# Patient Record
Sex: Female | Born: 1980
Health system: Southern US, Community
[De-identification: ages and names within clinical notes are randomized; demographics above are authoritative.]

## PROBLEM LIST (undated history)

## (undated) DIAGNOSIS — M549 Dorsalgia, unspecified: Secondary | ICD-10-CM

## (undated) DIAGNOSIS — F32A Depression, unspecified: Secondary | ICD-10-CM

## (undated) DIAGNOSIS — Z8669 Personal history of other diseases of the nervous system and sense organs: Secondary | ICD-10-CM

## (undated) DIAGNOSIS — J45909 Unspecified asthma, uncomplicated: Secondary | ICD-10-CM

## (undated) DIAGNOSIS — Q439 Congenital malformation of intestine, unspecified: Secondary | ICD-10-CM

## (undated) DIAGNOSIS — M5430 Sciatica, unspecified side: Secondary | ICD-10-CM

## (undated) DIAGNOSIS — F329 Major depressive disorder, single episode, unspecified: Secondary | ICD-10-CM

## (undated) DIAGNOSIS — G8929 Other chronic pain: Secondary | ICD-10-CM

## (undated) HISTORY — DX: Personal history of other diseases of the nervous system and sense organs: Z86.69

## (undated) HISTORY — PX: CHOLECYSTECTOMY: SHX55

## (undated) HISTORY — DX: Unspecified asthma, uncomplicated: J45.909

## (undated) HISTORY — PX: HAND SURGERY: SHX662

## (undated) HISTORY — PX: OTHER SURGICAL HISTORY: SHX169

## (undated) HISTORY — DX: Depression, unspecified: F32.A

## (undated) HISTORY — PX: GALLBLADDER SURGERY: SHX652

## (undated) HISTORY — DX: Major depressive disorder, single episode, unspecified: F32.9

---

## 2008-10-20 ENCOUNTER — Emergency Department (HOSPITAL_COMMUNITY): Admission: EM | Admit: 2008-10-20 | Discharge: 2008-10-21 | Payer: Self-pay | Admitting: Emergency Medicine

## 2009-05-28 ENCOUNTER — Emergency Department (HOSPITAL_COMMUNITY): Admission: EM | Admit: 2009-05-28 | Discharge: 2009-05-28 | Payer: Self-pay | Admitting: Emergency Medicine

## 2009-11-13 ENCOUNTER — Emergency Department (HOSPITAL_COMMUNITY): Admission: EM | Admit: 2009-11-13 | Discharge: 2009-11-14 | Payer: Self-pay | Admitting: Emergency Medicine

## 2010-09-15 ENCOUNTER — Encounter: Payer: Self-pay | Admitting: Cardiology

## 2010-09-24 NOTE — Letter (Signed)
Summary: External Correspondence/ PROGRESS NOTE DR. Loney Hering  External Correspondence/ PROGRESS NOTE DR. Loney Hering   Imported By: Dorise Hiss 09/15/2010 16:57:43  _____________________________________________________________________  External Attachment:    Type:   Image     Comment:   External Document

## 2010-10-09 ENCOUNTER — Encounter: Payer: Self-pay | Admitting: *Deleted

## 2010-10-27 ENCOUNTER — Ambulatory Visit (INDEPENDENT_AMBULATORY_CARE_PROVIDER_SITE_OTHER): Payer: Medicaid Other | Admitting: Cardiology

## 2010-10-27 ENCOUNTER — Encounter: Payer: Self-pay | Admitting: Cardiology

## 2010-10-27 VITALS — BP 114/82 | HR 76 | Ht 66.0 in | Wt 256.0 lb

## 2010-10-27 DIAGNOSIS — Z720 Tobacco use: Secondary | ICD-10-CM | POA: Insufficient documentation

## 2010-10-27 DIAGNOSIS — R0989 Other specified symptoms and signs involving the circulatory and respiratory systems: Secondary | ICD-10-CM

## 2010-10-27 DIAGNOSIS — F17200 Nicotine dependence, unspecified, uncomplicated: Secondary | ICD-10-CM

## 2010-10-27 DIAGNOSIS — R0602 Shortness of breath: Secondary | ICD-10-CM

## 2010-10-27 DIAGNOSIS — R0600 Dyspnea, unspecified: Secondary | ICD-10-CM | POA: Insufficient documentation

## 2010-10-27 DIAGNOSIS — R002 Palpitations: Secondary | ICD-10-CM

## 2010-10-27 DIAGNOSIS — R0609 Other forms of dyspnea: Secondary | ICD-10-CM

## 2010-10-27 NOTE — Assessment & Plan Note (Signed)
Patient quit smoking back in November 2011. I encouraged her to continue efforts at complete cessation.

## 2010-10-27 NOTE — Progress Notes (Signed)
Clinical Summary Pamela Duran is a 30 y.o.female referred for cardiology consultation. Records indicate followup with Dr. Loney Duran back in February with complaints of rapid palpitations and shortness of breath. She states that since early February, she has been experiencing rapid palpitations, noted sporadically throughout the day, perhaps 1-2 times, and occurring almost on a daily basis recently. She feels short of breath with these episodes, but has not had any unusual dyspnea on exertion or exertional chest pain. Mild dizziness noted but no frank syncope. Typical episodes last for only a few seconds to perhaps one minute at most.  She does not endorse any use of caffeine or other stimulants. She reports no medication changes recently.  Allergies  Allergen Reactions  . Sulfa Antibiotics Hives    Current Outpatient Prescriptions on File Prior to Visit  Medication Sig Dispense Refill  . ALPRAZolam (XANAX) 0.5 MG tablet Take 1 tablet by mouth twice per day as needed.       Marland Kitchen amitriptyline (ELAVIL) 25 MG tablet Take 1 tablet by mouth every night.       Marland Kitchen buPROPion (WELLBUTRIN) 100 MG tablet Take 1 tablet by mouth twice per day.       . cyclobenzaprine (FLEXERIL) 10 MG tablet Take 1 tablet by mouth three times per day.       . SUMAtriptan (IMITREX) 100 MG tablet Take 1 tablet by mouth daily as needed for headaches.       . triamcinolone (KENALOG) 0.1 % cream Apply three times per day as directed.       . hydrocortisone (ANUSOL-HC) 2.5 % rectal cream Apply twice per day as directed.         Past Medical History  Diagnosis Date  . Depression   . History of migraine headaches     Past Surgical History  Procedure Date  . Bilateral tubal ligation     Family History  Problem Relation Age of Onset  . Healthy Mother     No premature cardiovascular disease or known cardiac arrhythmias    Social History Pamela Duran reports that she quit smoking about 4 months ago. Her smoking use included  Cigarettes. She has never used smokeless tobacco. Pamela Duran reports that she drinks alcohol.  Review of Systems No fevers, chills, or unusual weight change. No chest pain, cough, hemoptysis, or wheezing. No dysphasia or odynophagia. Stable appetite with no abdominal pain, melena, or hematochezia. No orthopnea, PND, or lower extremity edema. Dry skin. No focal motor weakness, memory problems, or speech deficits. Otherwise systems reviewed and negative except as already outlined.  Physical Examination Filed Vitals:   10/27/10 1319  BP: 114/82  Pulse: 76  Obese young woman in no acute distress. HEENT: Conjunctiva and lids normal, oropharynx clear with moist mucosa. Neck: Supple, no elevated JVP or carotid bruits, no thyromegaly or thyroid tenderness. Lungs: Clear to auscultation, nonlabored breathing at rest. Cardiac: Regular rate and rhythm, no S3 or significant systolic murmur, no pericardial rub. Abdomen: Soft, nontender, no hepatomegaly, bowel sounds present, no guarding or rebound. Extremities: No pitting edema, distal pulses 2+. Skin: Warm and dry. No rashes. Musculoskeletal: No kyphosis. Neuropsychiatric: Alert and oriented x3, affect grossly appropriate.  ECG Faxed copy of ECG from 7 February shows normal sinus rhythm at 71 beats per minute, small R prime in lead V1 and V2. Otherwise normal intervals.  Problem List and Plan

## 2010-10-27 NOTE — Assessment & Plan Note (Signed)
Described only with the above palpitations, not at rest or with typical exertion.

## 2010-10-27 NOTE — Patient Instructions (Signed)
Your physician has recommended that you wear a holter monitor. Holter monitors are medical devices that record the heart's electrical activity. Doctors most often use these monitors to diagnose arrhythmias. Arrhythmias are problems with the speed or rhythm of the heartbeat. The monitor is a small, portable device. You can wear one while you do your normal daily activities. This is usually used to diagnose what is causing palpitations/syncope (passing out). Your follow up will be based on your test results. If the results of your test are normal or stable, you will receive a letter. If they are abnormal, the nurse will contact you by phone.  

## 2010-10-27 NOTE — Assessment & Plan Note (Signed)
Episodic, generally brief, rapid palpitations as outlined above. Symptoms noted since early February, not associated with frank syncope. She does experience some shortness of breath with these symptoms, although not typically with exertion. ECG is relatively benign, with normal intervals, and no clear evidence of preexcitation. She is experiencing these symptoms almost on a daily basis at this point. No caffeine use at present or other reported stimulants. Plan is to proceed with a 48-hour Holter monitor to objectively document heart rate variability and assess for any potential paroxysmal arrhythmias. Our office will contact her with the results, and can arrange followup as needed.

## 2013-03-06 ENCOUNTER — Encounter: Payer: Self-pay | Admitting: Neurology

## 2013-03-08 ENCOUNTER — Encounter: Payer: Medicaid Other | Admitting: Neurology

## 2013-03-08 ENCOUNTER — Encounter: Payer: Self-pay | Admitting: Neurology

## 2013-03-08 NOTE — Progress Notes (Deleted)
[facility]  Provider:  Dr Hosie Poisson Referring Provider: Ernestine Conrad, MD Primary Care Physician:  Ernestine Conrad, MD  No chief complaint on file.   HPI:    Pamela Duran is a 32 y.o. female here as a referral from Dr. Loney Hering for ***.  *** C5: pain neck, shoulder, scapula, numb lateral arm, weak deltoid, effect biceps/bracio reflex  C6; pain, neck, shoulder, lat arm, lat hand. Numb lat forearm, thumb and index finger, weak elbow flexio, forearm supination and prontation, decreased biceps and brachio  C7:pain neck, shoulder, middle finger; numb index and middle finger, palm; weak elbow and wrist ext, wrist flexion, prontation; decreasd triceps relfex  C8: pain neck, shoulder, medial forearm, 4/5th dig; numb same area; weak finger extension, wrist extension, distal finger flexion, distal thumb flexion  T1: pain neck, medial arm and forearm; numb anterior arm and medial forearm; weak thumb abduction, distal thumb flexion and finger abduction and adduction  -worse with flexion of elbow or sleeping For Ulnar: -pinky finger sticks out -weak finger grip -weak pincer grip btwn thumb and index (Flex distal thumb equals Froment sign) For Median: -weakness thumb aBduction and Opposition, FDP -Sensation intact over thenar eminence (palmar cutaneous branch) Review of Systems: Out of a complete 14 system review, the patient complains of only the following symptoms, and all other reviewed systems are negative. ***  History   Social History  . Marital Status: Legally Separated    Spouse Name: N/A    Number of Children: 1  . Years of Education: N/A   Occupational History  . Unemployed    Social History Main Topics  . Smoking status: Former Smoker    Types: Cigarettes    Quit date: 06/09/2010  . Smokeless tobacco: Never Used  . Alcohol Use: 0.0 oz/week  . Drug Use: No  . Sexually Active: Not on file   Other Topics Concern  . Not on file   Social History  Narrative  . No narrative on file    Family History  Problem Relation Age of Onset  . Healthy Mother     No premature cardiovascular disease or known cardiac arrhythmias    Past Medical History  Diagnosis Date  . Depression   . History of migraine headaches   . Asthma     Past Surgical History  Procedure Laterality Date  . Bilateral tubal ligation    . Gallbladder surgery      Current Outpatient Prescriptions  Medication Sig Dispense Refill  . ALPRAZolam (XANAX) 0.5 MG tablet Take 1 tablet by mouth twice per day as needed.       Marland Kitchen amitriptyline (ELAVIL) 50 MG tablet Take 50 mg by mouth at bedtime.      Marland Kitchen buPROPion (WELLBUTRIN) 100 MG tablet Take 1 tablet by mouth twice per day.       . cetirizine (ZYRTEC ALLERGY) 10 MG tablet Take 10 mg by mouth daily. As needed      . cyclobenzaprine (FLEXERIL) 10 MG tablet Take 1 tablet by mouth three times per day.       . hydrocortisone (ANUSOL-HC) 2.5 % rectal cream Apply twice per day as directed.       . Misc Natural Products (FIBER 7 PO) Take by mouth. 1 po daily       . omeprazole (PRILOSEC) 20 MG capsule Take 20 mg by mouth daily.      . SUMAtriptan (IMITREX) 100 MG tablet Take 1 tablet by mouth daily as needed for headaches.       Marland Kitchen  triamcinolone (KENALOG) 0.1 % cream Apply three times per day as directed.       . varenicline (CHANTIX) 1 MG tablet Take 1 mg by mouth 2 (two) times daily.       No current facility-administered medications for this visit.    Allergies as of 03/08/2013 - Review Complete 03/06/2013  Allergen Reaction Noted  . Sulfa antibiotics Hives     Vitals: There were no vitals taken for this visit. Last Weight:  Wt Readings from Last 1 Encounters:  03/06/13 253 lb (114.76 kg)   Last Height:   Ht Readings from Last 1 Encounters:  03/06/13 5\' 7"  (1.702 m)     Physical exam: Exam: Gen: NAD, conversant Eyes: anicteric sclerae, moist conjunctivae HENT: [place] Neck: Trachea midline; supple,   Lungs: CTA, no wheezing, rales, rhonic                          CV: RRR, no MRG Abdomen: Soft, non-tender;  Extremities: No peripheral edema  Skin: Normal temperature, no rash,  Psych: Appropriate affect, pleasant  Neuro: MS: AA&Ox3, appropriately interactive, normal affect   Attention: WORLD backwards  Speech: fluent w/o paraphasic error  Memory: good recent and remote recall  CN: PERRL, EOMI no nystagmus, no ptosis, sensation intact to LT V1-V3 bilat, face symmetric, no weakness, hearing grossly intact, palate elevates symmetrically, shoulder shrug 5/5 bilat,  tongue protrudes midline, no fasiculations noted.  Motor: normal bulk and tone Strength: 5/5  In all extremities  Coord: rapid alternating and point-to-point (FNF, HTS) movements intact.  Reflexes: symmetrical, bilat downgoing toes  Sens: LT intact in all extremities  Gait: posture, stance, stride and arm-swing normal. Tandem gait intact. Able to walk on heels and toes. Romberg absent.   Assessment:  After physical and neurologic examination, review of laboratory studies, imaging, neurophysiology testing and pre-existing records, assessment will be reviewed on the problem list.  Plan:  Treatment plan and additional workup will be reviewed under Problem List.

## 2013-03-08 NOTE — Progress Notes (Signed)
This encounter was created in error - please disregard.  Patient was a no-show.

## 2014-01-01 ENCOUNTER — Emergency Department (HOSPITAL_COMMUNITY)
Admission: EM | Admit: 2014-01-01 | Discharge: 2014-01-01 | Disposition: A | Discharge status: Home / Self Care | Payer: Medicaid Other | Attending: Emergency Medicine | Admitting: Emergency Medicine

## 2014-01-01 ENCOUNTER — Encounter (HOSPITAL_COMMUNITY): Payer: Self-pay | Admitting: Emergency Medicine

## 2014-01-01 ENCOUNTER — Emergency Department (HOSPITAL_COMMUNITY): Payer: Medicaid Other

## 2014-01-01 DIAGNOSIS — J45901 Unspecified asthma with (acute) exacerbation: Secondary | ICD-10-CM | POA: Insufficient documentation

## 2014-01-01 DIAGNOSIS — Y9389 Activity, other specified: Secondary | ICD-10-CM | POA: Insufficient documentation

## 2014-01-01 DIAGNOSIS — F329 Major depressive disorder, single episode, unspecified: Secondary | ICD-10-CM | POA: Insufficient documentation

## 2014-01-01 DIAGNOSIS — Z79899 Other long term (current) drug therapy: Secondary | ICD-10-CM | POA: Insufficient documentation

## 2014-01-01 DIAGNOSIS — Y929 Unspecified place or not applicable: Secondary | ICD-10-CM | POA: Insufficient documentation

## 2014-01-01 DIAGNOSIS — G43909 Migraine, unspecified, not intractable, without status migrainosus: Secondary | ICD-10-CM | POA: Insufficient documentation

## 2014-01-01 DIAGNOSIS — S8392XA Sprain of unspecified site of left knee, initial encounter: Secondary | ICD-10-CM

## 2014-01-01 DIAGNOSIS — W108XXA Fall (on) (from) other stairs and steps, initial encounter: Secondary | ICD-10-CM | POA: Insufficient documentation

## 2014-01-01 DIAGNOSIS — Z87891 Personal history of nicotine dependence: Secondary | ICD-10-CM | POA: Insufficient documentation

## 2014-01-01 DIAGNOSIS — IMO0002 Reserved for concepts with insufficient information to code with codable children: Secondary | ICD-10-CM | POA: Insufficient documentation

## 2014-01-01 DIAGNOSIS — F3289 Other specified depressive episodes: Secondary | ICD-10-CM | POA: Insufficient documentation

## 2014-01-01 HISTORY — DX: Congenital malformation of intestine, unspecified: Q43.9

## 2014-01-01 MED ORDER — HYDROCODONE-ACETAMINOPHEN 5-325 MG PO TABS: 1.0000 | ORAL_TABLET | ORAL | Status: DC | PRN

## 2014-01-01 MED ORDER — MELOXICAM 7.5 MG PO TABS: ORAL_TABLET | ORAL | Status: DC

## 2014-01-01 NOTE — Discharge Instructions (Signed)
Your knee x-ray is negative for fracture or dislocation or effusion. Please use the knee immobilizer until you're seen by the orthopedic specialist. You do not have to sleep in this device. Please use Mobic 2 times daily with food. May use Norco for pain if needed. This medication may cause drowsiness, please use with caution. Please see the orthopedic specialist as sone as possible. Knee Sprain A knee sprain is a tear in the strong bands of tissue that connect the bones (ligaments) of your knee. HOME CARE  Raise (elevate) your injured knee to lessen puffiness (swelling).  To ease pain and puffiness, put ice on the injured area.  Put ice in a plastic bag.  Place a towel between your skin and the bag.  Leave the ice on for 20 minutes, 2 3 times a day.  Only take medicine as told by your doctor.  Do not leave your knee unprotected until pain and stiffness go away (usually 4 6 weeks).  If you have a cast or splint, do not get it wet. If your doctor told you to not take it off, cover it with a plastic bag when you shower or bathe. Do not swim.  Your doctor may have you do exercises to prevent or limit permanent weakness and stiffness. GET HELP RIGHT AWAY IF:   Your cast or splint becomes damaged.  Your pain gets worse.  You have a lot of pain, puffiness, or numbness below the cast or splint. MAKE SURE YOU:   Understand these instructions.  Will watch your condition.  Will get help right away if you are not doing well or get worse. Document Released: 07/14/2009 Document Revised: 05/16/2013 Document Reviewed: 04/03/2013 [facility] Patient Information 2014 [place], [place].

## 2014-01-01 NOTE — ED Notes (Signed)
Coming down steps and left knee gave way.  C/o pain to that knee.

## 2014-01-01 NOTE — ED Notes (Signed)
Pt was going down steps and lt knee "gave way" then fell, Pain rt knee. Says she has had pain in this knee in past , but has never had it checked.  Ice pack applied.

## 2014-01-01 NOTE — ED Provider Notes (Signed)
CSN: [id number]     Arrival date & time 01/01/14  1129 History   First MD Initiated Contact with Patient 01/01/14 1224     Chief Complaint  Patient presents with  . Knee Injury     (Consider location/radiation/quality/duration/timing/severity/associated sxs/prior Treatment) Patient is a [age] y.o. female presenting with knee pain. The history is provided by the patient.  Knee Pain Location:  Knee Time since incident:  1 hour Injury: yes   Mechanism of injury: fall   Fall:    Fall occurred: 2 steps.   Impact surface:  Hard floor   Point of impact:  Knees   Entrapped after fall: no   Knee location:  L knee Pain details:    Quality:  Sharp   Severity:  Moderate   Onset quality:  Sudden   Duration:  1 hour   Timing:  Constant   Progression:  Worsening Chronicity: Pt has had problem with the left knee in the past, but not medical evaluation. Dislocation: no   Relieved by:  Nothing Worsened by:  Bearing weight Ineffective treatments:  None tried Associated symptoms: decreased ROM   Associated symptoms: no back pain, no neck pain, no numbness and no tingling   Risk factors: no frequent fractures     Past Medical History  Diagnosis Date  . Depression     complex regional pain syndrome   . History of migraine headaches   . Asthma   . Complex anorectal malformation    Past Surgical History  Procedure Laterality Date  . Bilateral tubal ligation    . Gallbladder surgery     Family History  Problem Relation Age of Onset  . Healthy Mother     No premature cardiovascular disease or known cardiac arrhythmias   History  Substance Use Topics  . Smoking status: Former Smoker    Types: Cigarettes    Quit date: 06/09/2010  . Smokeless tobacco: Never Used  . Alcohol Use: 0.0 oz/week   OB History   Grav Para Term Preterm Abortions TAB SAB Ect Mult Living                 Review of Systems  Constitutional: Negative for activity change.       All ROS Neg except as noted in  HPI  HENT: Negative for nosebleeds.   Eyes: Negative for photophobia and discharge.  Respiratory: Positive for wheezing. Negative for cough and shortness of breath.   Cardiovascular: Negative for chest pain and palpitations.  Gastrointestinal: Negative for abdominal pain and blood in stool.  Genitourinary: Negative for dysuria, frequency and hematuria.  Musculoskeletal: Positive for arthralgias. Negative for back pain and neck pain.  Skin: Negative.   Neurological: Negative for dizziness, seizures and speech difficulty.  Psychiatric/Behavioral: Negative for hallucinations and confusion.      Allergies  Sulfa antibiotics  Home Medications   Prior to Admission medications   Medication Sig Start Date End Date Taking? Authorizing Provider  acetaminophen (TYLENOL) 325 MG tablet Take 650 mg by mouth every 6 (six) hours as needed for mild pain.   Yes Historical Provider, MD  ALPRAZolam Prudy Feeler(XANAX) 0.5 MG tablet Take 0.5 mg by mouth 2 (two) times daily as needed for anxiety. Take 1 tablet by mouth twice per day as needed.   Yes Historical Provider, MD  amitriptyline (ELAVIL) 50 MG tablet Take 50 mg by mouth at bedtime as needed (migraines).    Yes Historical Provider, MD  buPROPion (WELLBUTRIN) 100 MG tablet Take 1 tablet  by mouth twice per day.    Yes Historical Provider, MD  cetirizine (ZYRTEC ALLERGY) 10 MG tablet Take 10 mg by mouth daily.    Yes Historical Provider, MD  omeprazole (PRILOSEC) 20 MG capsule Take 20 mg by mouth daily.   Yes Historical Provider, MD  SUMAtriptan (IMITREX) 100 MG tablet Take 1 tablet by mouth daily as needed for headaches.    Yes Historical Provider, MD   BP 113/10  Pulse 74  Temp(Src) 98.3 F (36.8 C) (Oral)  Resp 18  Ht 5\' 7"  (1.702 m)  Wt 273 lb (123.832 kg)  BMI 42.75 kg/m2  SpO2 97%  LMP 12/04/2013 Physical Exam  Nursing note and vitals reviewed. Constitutional: She is oriented to person, place, and time. She appears well-developed and  well-nourished.  Non-toxic appearance.  HENT:  Head: Normocephalic.  Right Ear: Tympanic membrane and external ear normal.  Left Ear: Tympanic membrane and external ear normal.  Eyes: EOM and lids are normal. Pupils are equal, round, and reactive to light.  Neck: Normal range of motion. Neck supple. Carotid bruit is not present.  Cardiovascular: Normal rate, regular rhythm, normal heart sounds, intact distal pulses and normal pulses.   Pulmonary/Chest: Breath sounds normal. No respiratory distress.  Abdominal: Soft. Bowel sounds are normal. There is no tenderness. There is no guarding.  Musculoskeletal:       Left knee: She exhibits decreased range of motion. She exhibits no deformity and no erythema. Tenderness found. Lateral joint line tenderness noted.       Legs: FROM of the left hip and ankle. Achilles intact. DP 2+.  Lymphadenopathy:       Head (right side): No submandibular adenopathy present.       Head (left side): No submandibular adenopathy present.    She has no cervical adenopathy.  Neurological: She is alert and oriented to person, place, and time. She has normal strength. No cranial nerve deficit or sensory deficit.  Skin: Skin is warm and dry.  Psychiatric: She has a normal mood and affect. Her speech is normal.    ED Course  Procedures (including critical care time) Labs Review Labs Reviewed - No data to display  Imaging Review No results found.   EKG Interpretation None      MDM Xray  Of the left knee is neg for fx or dislocation. Pt placed in immobilizer, and given Rx for norco and mobic. Pt to follow up with orthopedics if not improving.   Final diagnoses:  None    **I have reviewed nursing notes, vital signs, and all appropriate lab and imaging results for this patient.Kathie Dike, PA-C 01/02/14 [phone]

## 2014-01-02 NOTE — ED Provider Notes (Signed)
Medical screening examination/treatment/procedure(s) were performed by non-physician practitioner and as supervising physician I was immediately available for consultation/collaboration.   EKG Interpretation None      \  Benny Lennert, MD 01/02/14 [phone]

## 2014-04-29 ENCOUNTER — Emergency Department (HOSPITAL_COMMUNITY)
Admission: EM | Admit: 2014-04-29 | Discharge: 2014-04-29 | Disposition: A | Discharge status: Home / Self Care | Payer: Medicaid Other | Attending: Emergency Medicine | Admitting: Emergency Medicine

## 2014-04-29 ENCOUNTER — Encounter (HOSPITAL_COMMUNITY): Payer: Self-pay | Admitting: Emergency Medicine

## 2014-04-29 DIAGNOSIS — M5431 Sciatica, right side: Secondary | ICD-10-CM

## 2014-04-29 DIAGNOSIS — M5430 Sciatica, unspecified side: Secondary | ICD-10-CM | POA: Diagnosis not present

## 2014-04-29 DIAGNOSIS — IMO0002 Reserved for concepts with insufficient information to code with codable children: Secondary | ICD-10-CM | POA: Diagnosis not present

## 2014-04-29 DIAGNOSIS — Z79899 Other long term (current) drug therapy: Secondary | ICD-10-CM | POA: Diagnosis not present

## 2014-04-29 DIAGNOSIS — Z880 Allergy status to penicillin: Secondary | ICD-10-CM | POA: Insufficient documentation

## 2014-04-29 DIAGNOSIS — M545 Low back pain, unspecified: Secondary | ICD-10-CM | POA: Insufficient documentation

## 2014-04-29 DIAGNOSIS — Q438 Other specified congenital malformations of intestine: Secondary | ICD-10-CM | POA: Diagnosis not present

## 2014-04-29 DIAGNOSIS — R5381 Other malaise: Secondary | ICD-10-CM | POA: Diagnosis not present

## 2014-04-29 DIAGNOSIS — E669 Obesity, unspecified: Secondary | ICD-10-CM | POA: Insufficient documentation

## 2014-04-29 DIAGNOSIS — J45909 Unspecified asthma, uncomplicated: Secondary | ICD-10-CM | POA: Insufficient documentation

## 2014-04-29 DIAGNOSIS — F17200 Nicotine dependence, unspecified, uncomplicated: Secondary | ICD-10-CM | POA: Diagnosis not present

## 2014-04-29 DIAGNOSIS — R5383 Other fatigue: Secondary | ICD-10-CM | POA: Diagnosis not present

## 2014-04-29 DIAGNOSIS — F329 Major depressive disorder, single episode, unspecified: Secondary | ICD-10-CM | POA: Insufficient documentation

## 2014-04-29 DIAGNOSIS — F3289 Other specified depressive episodes: Secondary | ICD-10-CM | POA: Insufficient documentation

## 2014-04-29 MED ORDER — HYDROCODONE-ACETAMINOPHEN 5-325 MG PO TABS: 1.0000 | ORAL_TABLET | ORAL | Status: DC | PRN

## 2014-04-29 MED ORDER — PREDNISONE 10 MG PO TABS: ORAL_TABLET | ORAL | Status: DC

## 2014-04-29 NOTE — Discharge Instructions (Signed)
Sciatica °Sciatica is pain, weakness, numbness, or tingling along the path of the sciatic nerve. The nerve starts in the lower back and runs down the back of each leg. The nerve controls the muscles in the lower leg and in the back of the knee, while also providing sensation to the back of the thigh, lower leg, and the sole of your foot. Sciatica is a symptom of another medical condition. For instance, nerve damage or certain conditions, such as a herniated disk or bone spur on the spine, pinch or put pressure on the sciatic nerve. This causes the pain, weakness, or other sensations normally associated with sciatica. Generally, sciatica only affects one side of the body. °CAUSES  °· Herniated or slipped disc. °· Degenerative disk disease. °· A pain disorder involving the narrow muscle in the buttocks (piriformis syndrome). °· Pelvic injury or fracture. °· Pregnancy. °· Tumor (rare). °SYMPTOMS  °Symptoms can vary from mild to very severe. The symptoms usually travel from the low back to the buttocks and down the back of the leg. Symptoms can include: °· Mild tingling or dull aches in the lower back, leg, or hip. °· Numbness in the back of the calf or sole of the foot. °· Burning sensations in the lower back, leg, or hip. °· Sharp pains in the lower back, leg, or hip. °· Leg weakness. °· Severe back pain inhibiting movement. °These symptoms may get worse with coughing, sneezing, laughing, or prolonged sitting or standing. Also, being overweight may worsen symptoms. °DIAGNOSIS  °Your caregiver will perform a physical exam to look for common symptoms of sciatica. He or she may ask you to do certain movements or activities that would trigger sciatic nerve pain. Other tests may be performed to find the cause of the sciatica. These may include: °· Blood tests. °· X-rays. °· Imaging tests, such as an MRI or CT scan. °TREATMENT  °Treatment is directed at the cause of the sciatic pain. Sometimes, treatment is not necessary  and the pain and discomfort goes away on its own. If treatment is needed, your caregiver may suggest: °· Over-the-counter medicines to relieve pain. °· Prescription medicines, such as anti-inflammatory medicine, muscle relaxants, or narcotics. °· Applying heat or ice to the painful area. °· Steroid injections to lessen pain, irritation, and inflammation around the nerve. °· Reducing activity during periods of pain. °· Exercising and stretching to strengthen your abdomen and improve flexibility of your spine. Your caregiver may suggest losing weight if the extra weight makes the back pain worse. °· Physical therapy. °· Surgery to eliminate what is pressing or pinching the nerve, such as a bone spur or part of a herniated disk. °HOME CARE INSTRUCTIONS  °· Only take over-the-counter or prescription medicines for pain or discomfort as directed by your caregiver. °· Apply ice to the affected area for 20 minutes, 3-4 times a day for the first 48-72 hours. Then try heat in the same way. °· Exercise, stretch, or perform your usual activities if these do not aggravate your pain. °· Attend physical therapy sessions as directed by your caregiver. °· Keep all follow-up appointments as directed by your caregiver. °· Do not wear high heels or shoes that do not provide proper support. °· Check your mattress to see if it is too soft. A firm mattress may lessen your pain and discomfort. °SEEK IMMEDIATE MEDICAL CARE IF:  °· You lose control of your bowel or bladder (incontinence). °· You have increasing weakness in the lower back, pelvis, buttocks,   or legs.  You have redness or swelling of your back.  You have a burning sensation when you urinate.  You have pain that gets worse when you lie down or awakens you at night.  Your pain is worse than you have experienced in the past.  Your pain is lasting longer than 4 weeks.  You are suddenly losing weight without reason. MAKE SURE YOU:  Understand these  instructions.  Will watch your condition.  Will get help right away if you are not doing well or get worse. Document Released: 07/20/2001 Document Revised: 01/25/2012 Document Reviewed: 12/05/2011 [facility] Patient Information 2015 [place], [place]. This information is not intended to replace advice given to you by your health care provider. Make sure you discuss any questions you have with your health care provider.    Use the the other medicines as directed.  Do not drive within 4 hours of taking hydrocodone as this will make you drowsy.  Avoid lifting,  Bending,  Twisting or any other activity that worsens your pain over the next week.  Apply a heating pad 20 minutes 3 times daily.  Add your Flexeril as discussed 3 times daily.  You should get rechecked if your symptoms are not better over the next 5 days,  Or you develop increased pain,  Weakness in your leg(s) or loss of bladder or bowel function - these are symptoms of a worse injury.

## 2014-04-29 NOTE — ED Notes (Signed)
Pt was moving furniture this am. And felt pain in rt low back with radiation down rt leg

## 2014-04-29 NOTE — ED Notes (Signed)
Pt c/o lower back pain radiating down right leg after moving furniture this am.

## 2014-04-29 NOTE — ED Provider Notes (Signed)
CSN: [id number]     Arrival date & time 04/29/14  1717 History  This chart was scribed for Burgess Amor, PA with Donnetta Hutching, MD by Tonye Royalty, ED Scribe. This patient was seen in room APFT23/APFT23 and the patient's care was started at [facility] PM.    Chief Complaint  Patient presents with  . Back Pain   The history is provided by the patient. No language interpreter was used.   HPI Comments: Pamela Duran is a 33 y.o. female who presents to the Emergency Department complaining of right-sided lower back pain radiating down her right leg with gradual onset this morning after moving furniture. She reports chronically having back pain when she sits or stands for too long; she states she has never seen a doctor for this pain. She states her current pain feels different from her chronic pain. She states her legs feel like they "want to give out." She reports using Tylenol and Motrin without noticeable improvement in symptoms.  She denies loss of bowel or bladder function including incontinence or urinary retention.   Past Medical History  Diagnosis Date  . Depression     complex regional pain syndrome   . History of migraine headaches   . Asthma   . Complex anorectal malformation    Past Surgical History  Procedure Laterality Date  . Bilateral tubal ligation    . Gallbladder surgery     Family History  Problem Relation Age of Onset  . Healthy Mother     No premature cardiovascular disease or known cardiac arrhythmias   History  Substance Use Topics  . Smoking status: Current Every Day Smoker -- 1.00 packs/day    Types: Cigarettes    Last Attempt to Quit: 06/09/2010  . Smokeless tobacco: Never Used  . Alcohol Use: 0.0 oz/week   OB History   Grav Para Term Preterm Abortions TAB SAB Ect Mult Living                 Review of Systems  Constitutional: Negative for fever.  Respiratory: Negative for shortness of breath.   Cardiovascular: Negative for chest pain and leg swelling.   Gastrointestinal: Negative for abdominal pain, constipation and abdominal distention.  Genitourinary: Negative for dysuria, urgency, frequency, flank pain and difficulty urinating.  Musculoskeletal: Positive for back pain (radiating down right leg). Negative for gait problem, joint swelling and myalgias.  Skin: Negative for rash.  Neurological: Positive for weakness. Negative for numbness.       Denies loss of bowel or bladder      Allergies  Penicillins and Sulfa antibiotics  Home Medications   Prior to Admission medications   Medication Sig Start Date End Date Taking? Authorizing Provider  acetaminophen (TYLENOL) 325 MG tablet Take 650 mg by mouth every 6 (six) hours as needed for mild pain.   Yes Historical Provider, MD  buPROPion (WELLBUTRIN) 100 MG tablet Take 1 tablet by mouth twice per day.    Yes Historical Provider, MD  cetirizine (ZYRTEC ALLERGY) 10 MG tablet Take 10 mg by mouth daily.    Yes Historical Provider, MD  omeprazole (PRILOSEC) 20 MG capsule Take 20 mg by mouth daily.   Yes Historical Provider, MD  HYDROcodone-acetaminophen (NORCO/VICODIN) 5-325 MG per tablet Take 1 tablet by mouth every 4 (four) hours as needed. 04/29/14   Burgess Amor, PA-C  predniSONE (DELTASONE) 10 MG tablet 6, 5, 4, 3, 2 then 1 tablet by mouth daily for 6 days total. 04/29/14   Raynelle Fanning  Mekia Dipinto, PA-C   BP 117/72  Pulse 75  Temp(Src) 99.1 F (37.3 C)  Resp 20  Ht  (1.702 m)  Wt 253 lb (114.76 kg)  BMI 39.62 kg/m2  SpO2 99% Physical Exam  Nursing note and vitals reviewed. Constitutional: She appears well-nourished.  Obese  HENT:  Head: Normocephalic.  Eyes: Conjunctivae are normal.  Neck: Normal range of motion. Neck supple.  Cardiovascular: Normal rate and intact distal pulses.   Pedal pulses normal.  Pulmonary/Chest: Effort normal.  Abdominal: Soft. Bowel sounds are normal. She exhibits no distension and no mass.  Musculoskeletal: Normal range of motion. She exhibits tenderness  (along midline lower lumbar and right paralumbar). She exhibits no edema.       Lumbar back: She exhibits tenderness. She exhibits no swelling, no edema and no spasm.  Negative straight leg raise  Neurological: She is alert. She has normal strength. She displays no atrophy and no tremor. No sensory deficit. Gait normal.  Reflex Scores:      Patellar reflexes are 2+ on the right side and 2+ on the left side.      Achilles reflexes are 2+ on the right side and 2+ on the left side. No strength deficit noted in hip and knee flexor and extensor muscle groups.  Ankle flexion and extension intact.  Skin: Skin is warm and dry.  Psychiatric: She has a normal mood and affect.    ED Course  Procedures (including critical care time) Labs Review Labs Reviewed - No data to display  Imaging Review No results found.   EKG Interpretation None     DIAGNOSTIC STUDIES: Oxygen Saturation is 99% on room air, normal by my interpretation.    COORDINATION OF CARE: 6:52 PM Discussed treatment plan with patient at beside, the patient agrees with the plan and has no further questions at this time.   MDM   Final diagnoses:  Sciatica, right    No neuro deficit on exam or by history to suggest emergent or surgical presentation.  Also discussed worsened sx that should prompt immediate re-evaluation including distal weakness, bowel/bladder retention/incontinence.  Pt was placed on prednisone, hydrocodone, encouraged activity as tolerated without activity that worsens pain, heat tx, f/u with pcp if not improving over the next week.   I personally performed the services described in this documentation, which was scribed in my presence. The recorded information has been reviewed and is accurate.   Burgess Amor, PA-C 05/01/14 1512

## 2014-05-03 NOTE — ED Provider Notes (Signed)
Medical screening examination/treatment/procedure(s) were performed by non-physician practitioner and as supervising physician I was immediately available for consultation/collaboration.   EKG Interpretation None       Cobey Raineri, MD 05/03/14 2016 

## 2014-05-05 ENCOUNTER — Encounter (HOSPITAL_COMMUNITY): Payer: Self-pay | Admitting: Emergency Medicine

## 2014-05-05 ENCOUNTER — Emergency Department (HOSPITAL_COMMUNITY)
Admission: EM | Admit: 2014-05-05 | Discharge: 2014-05-05 | Disposition: A | Discharge status: Home / Self Care | Payer: Medicaid Other | Attending: Emergency Medicine | Admitting: Emergency Medicine

## 2014-05-05 DIAGNOSIS — M5430 Sciatica, unspecified side: Secondary | ICD-10-CM | POA: Insufficient documentation

## 2014-05-05 DIAGNOSIS — M545 Low back pain, unspecified: Secondary | ICD-10-CM | POA: Diagnosis present

## 2014-05-05 DIAGNOSIS — Q438 Other specified congenital malformations of intestine: Secondary | ICD-10-CM | POA: Diagnosis not present

## 2014-05-05 DIAGNOSIS — G8929 Other chronic pain: Secondary | ICD-10-CM | POA: Diagnosis not present

## 2014-05-05 DIAGNOSIS — J45909 Unspecified asthma, uncomplicated: Secondary | ICD-10-CM | POA: Diagnosis not present

## 2014-05-05 DIAGNOSIS — M5441 Lumbago with sciatica, right side: Secondary | ICD-10-CM

## 2014-05-05 DIAGNOSIS — Z8659 Personal history of other mental and behavioral disorders: Secondary | ICD-10-CM | POA: Insufficient documentation

## 2014-05-05 DIAGNOSIS — IMO0002 Reserved for concepts with insufficient information to code with codable children: Secondary | ICD-10-CM | POA: Diagnosis not present

## 2014-05-05 DIAGNOSIS — G40909 Epilepsy, unspecified, not intractable, without status epilepticus: Secondary | ICD-10-CM | POA: Diagnosis not present

## 2014-05-05 DIAGNOSIS — Z880 Allergy status to penicillin: Secondary | ICD-10-CM | POA: Insufficient documentation

## 2014-05-05 DIAGNOSIS — F17200 Nicotine dependence, unspecified, uncomplicated: Secondary | ICD-10-CM | POA: Diagnosis not present

## 2014-05-05 DIAGNOSIS — Z79899 Other long term (current) drug therapy: Secondary | ICD-10-CM | POA: Insufficient documentation

## 2014-05-05 HISTORY — DX: Other chronic pain: G89.29

## 2014-05-05 HISTORY — DX: Dorsalgia, unspecified: M54.9

## 2014-05-05 HISTORY — DX: Sciatica, unspecified side: M54.30

## 2014-05-05 MED ORDER — OXYCODONE-ACETAMINOPHEN 5-325 MG PO TABS: 1.0000 | ORAL_TABLET | ORAL | Status: DC | PRN

## 2014-05-05 NOTE — Discharge Instructions (Signed)
Back Pain, Adult °Back pain is very common. The pain often gets better over time. The cause of back pain is usually not dangerous. Most people can learn to manage their back pain on their own.  °HOME CARE  °· Stay active. Start with short walks on flat ground if you can. Try to walk farther each day. °· Do not sit, drive, or stand in one place for more than 30 minutes. Do not stay in bed. °· Do not avoid exercise or work. Activity can help your back heal faster. °· Be careful when you bend or lift an object. Bend at your knees, keep the object close to you, and do not twist. °· Sleep on a firm mattress. Lie on your side, and bend your knees. If you lie on your back, put a pillow under your knees. °· Only take medicines as told by your doctor. °· Put ice on the injured area. °¨ Put ice in a plastic bag. °¨ Place a towel between your skin and the bag. °¨ Leave the ice on for 15-20 minutes, 03-04 times a day for the first 2 to 3 days. After that, you can switch between ice and heat packs. °· Ask your doctor about back exercises or massage. °· Avoid feeling anxious or stressed. Find good ways to deal with stress, such as exercise. °GET HELP RIGHT AWAY IF:  °· Your pain does not go away with rest or medicine. °· Your pain does not go away in 1 week. °· You have new problems. °· You do not feel well. °· The pain spreads into your legs. °· You cannot control when you poop (bowel movement) or pee (urinate). °· Your arms or legs feel weak or lose feeling (numbness). °· You feel sick to your stomach (nauseous) or throw up (vomit). °· You have belly (abdominal) pain. °· You feel like you may pass out (faint). °MAKE SURE YOU:  °· Understand these instructions. °· Will watch your condition. °· Will get help right away if you are not doing well or get worse. °Document Released: 01/12/2008 Document Revised: 10/18/2011 Document Reviewed: 11/27/2013 °ExitCare® Patient Information ©2015 [employer]. This information is not intended  to replace advice given to you by your health care provider. Make sure you discuss any questions you have with your health care provider. ° °Sciatica °Sciatica is pain, weakness, numbness, or tingling along your sciatic nerve. The nerve starts in the lower back and runs down the back of each leg. Nerve damage or certain conditions pinch or put pressure on the sciatic nerve. This causes the pain, weakness, and other discomforts of sciatica. °HOME CARE  °· Only take medicine as told by your doctor. °· Apply ice to the affected area for 20 minutes. Do this 3-4 times a day for the first 48-72 hours. Then try heat in the same way. °· Exercise, stretch, or do your usual activities if these do not make your pain worse. °· Go to physical therapy as told by your doctor. °· Keep all doctor visits as told. °· Do not wear high heels or shoes that are not supportive. °· Get a firm mattress if your mattress is too soft to lessen pain and discomfort. °GET HELP RIGHT AWAY IF:  °· You cannot control when you poop (bowel movement) or pee (urinate). °· You have more weakness in your lower back, lower belly (pelvis), butt (buttocks), or legs. °· You have redness or puffiness (swelling) of your back. °· You have a burning feeling   when you pee. °· You have pain that gets worse when you lie down. °· You have pain that wakes you from your sleep. °· Your pain is worse than past pain. °· Your pain lasts longer than 4 weeks. °· You are suddenly losing weight without reason. °MAKE SURE YOU:  °· Understand these instructions. °· Will watch this condition. °· Will get help right away if you are not doing well or get worse. °Document Released: 05/04/2008 Document Revised: 01/25/2012 Document Reviewed: 12/05/2011 °ExitCare® Patient Information ©2015 [employer]. This information is not intended to replace advice given to you by your health care provider. Make sure you discuss any questions you have with your health care provider. ° °

## 2014-05-05 NOTE — ED Notes (Signed)
Pt was here on Monday for back pain, was told if no better to come back for recheck, pt taking flexeril and prednisone without relief.

## 2014-05-08 NOTE — ED Provider Notes (Signed)
CSN: [id number]     Arrival date & time 05/05/14  1341 History   First MD Initiated Contact with Patient 05/05/14 1617     Chief Complaint  Patient presents with  . Back Pain     (Consider location/radiation/quality/duration/timing/severity/associated sxs/prior Treatment) HPI   Pamela Duran is a 33 y.o. female who presents to the Emergency Department complaining of low back pain for several days.  She states she was seen here two days ago and advised to return if pain was not improving.  She states the pain medication given previously has helped only slightly and back pain is similar to back pain .  She denies dysuria, incontinence of bowel or bladder, abdominal pain or numbness or weakness of the lower extremities.  She has not contacted her PMD regarding her pain.  Past Medical History  Diagnosis Date  . Depression     complex regional pain syndrome   . History of migraine headaches   . Asthma   . Complex anorectal malformation   . Chronic back pain   . Sciatica    Past Surgical History  Procedure Laterality Date  . Bilateral tubal ligation    . Gallbladder surgery    . Hand surgery     Family History  Problem Relation Age of Onset  . Healthy Mother     No premature cardiovascular disease or known cardiac arrhythmias   History  Substance Use Topics  . Smoking status: Current Every Day Smoker -- 1.00 packs/day    Types: Cigarettes    Last Attempt to Quit: 06/09/2010  . Smokeless tobacco: Never Used  . Alcohol Use: 0.0 oz/week     Comment: rarely   OB History   Grav Para Term Preterm Abortions TAB SAB Ect Mult Living                 Review of Systems  Constitutional: Negative for fever.  Respiratory: Negative for shortness of breath.   Gastrointestinal: Negative for vomiting, abdominal pain and constipation.  Genitourinary: Negative for dysuria, hematuria, flank pain, decreased urine volume and difficulty urinating.  Musculoskeletal: Positive for back pain.  Negative for joint swelling.  Skin: Negative for rash.  Neurological: Negative for weakness and numbness.  All other systems reviewed and are negative.     Allergies  Penicillins and Sulfa antibiotics  Home Medications   Prior to Admission medications   Medication Sig Start Date End Date Taking? Authorizing Provider  cetirizine (ZYRTEC ALLERGY) 10 MG tablet Take 10 mg by mouth daily.    Yes Historical Provider, MD  cyclobenzaprine (FLEXERIL) 10 MG tablet Take 10 mg by mouth at bedtime.   Yes Historical Provider, MD  HYDROcodone-acetaminophen (NORCO/VICODIN) 5-325 MG per tablet Take 1 tablet by mouth every 4 (four) hours as needed. 04/29/14  Yes Burgess AmorJulie Idol, PA-C  levonorgestrel (MIRENA) 20 MCG/24HR IUD 1 each by Intrauterine route once.   Yes Historical Provider, MD  omeprazole (PRILOSEC) 20 MG capsule Take 20 mg by mouth daily.   Yes Historical Provider, MD  predniSONE (DELTASONE) 10 MG tablet Take 10-60 mg by mouth 3 (three) times daily. 6, 5, 4, 3, 2 then 1 tablet by mouth daily for 6 days total. Patient has broken the dosages down to 3 times per day. 04/29/14  Yes Burgess AmorJulie Idol, PA-C  pseudoephedrine (SUDAFED) 30 MG tablet Take 30 mg by mouth every 4 (four) hours as needed for congestion.   Yes Historical Provider, MD  oxyCODONE-acetaminophen (PERCOCET/ROXICET) 5-325 MG per tablet Take  1 tablet by mouth every 4 (four) hours as needed. 05/05/14   Catalino Plascencia L. Sanav Remer, PA-C   BP 121/85  Pulse 70  Temp(Src) 98.1 F (36.7 C) (Oral)  Resp 16  Ht 5\' 7"  (1.702 m)  Wt 253 lb (114.76 kg)  BMI 39.62 kg/m2  SpO2 99% Physical Exam  Nursing note and vitals reviewed. Constitutional: She is oriented to person, place, and time. She appears well-developed and well-nourished. No distress.  HENT:  Head: Normocephalic and atraumatic.  Neck: Normal range of motion. Neck supple.  Cardiovascular: Normal rate, regular rhythm, normal heart sounds and intact distal pulses.   No murmur  heard. Pulmonary/Chest: Effort normal and breath sounds normal. No respiratory distress.  Abdominal: Soft. She exhibits no distension. There is no tenderness.  Musculoskeletal: She exhibits tenderness. She exhibits no edema.       Lumbar back: She exhibits tenderness and pain. She exhibits normal range of motion, no swelling, no deformity, no laceration and normal pulse.  Diffuse ttp of the right lumbar paraspinal muscles and SI joint.  No spinal tenderness.  DP pulses are brisk and symmetrical.  Distal sensation intact.  Hip Flexors/Extensors are intact.  Pt has 5/5 strength against resistance of bilateral lower extremities.     Neurological: She is alert and oriented to person, place, and time. She has normal strength. No sensory deficit. She exhibits normal muscle tone. Coordination and gait normal.  Reflex Scores:      Patellar reflexes are 2+ on the right side and 2+ on the left side.      Achilles reflexes are 2+ on the right side and 2+ on the left side. Skin: Skin is warm and dry. No rash noted.    ED Course  Procedures (including critical care time) Labs Review Labs Reviewed - No data to display  Imaging Review No results found.   EKG Interpretation None      MDM   Final diagnoses:  Right-sided low back pain with right-sided sciatica    Pt here two days ago for same.  Has h/o chronic low back pain and returns stating the medication given previously is not controlling the pain.  No concerning sx's on exam for emergent neurological or infectious process.  Pt ambulates with steady gait.  No focal neuro deficits.  rx given for #12 percocet and advised to continue the prednisone as directed.  She appears stable for d/c and agrees to arrange further f/u with her PMD    Dusti Tetro L. Trisha Mangle, PA-C 05/08/14 1652

## 2014-05-13 NOTE — ED Provider Notes (Signed)
Medical screening examination/treatment/procedure(s) were performed by non-physician practitioner and as supervising physician I was immediately available for consultation/collaboration.   EKG Interpretation None       TRUE Garciamartinez, MD 05/13/14 0816 

## 2014-06-10 ENCOUNTER — Encounter (HOSPITAL_COMMUNITY): Payer: Self-pay | Admitting: *Deleted

## 2014-06-10 ENCOUNTER — Emergency Department (HOSPITAL_COMMUNITY)
Admission: EM | Admit: 2014-06-10 | Discharge: 2014-06-10 | Disposition: A | Discharge status: Home / Self Care | Payer: Medicaid Other | Attending: Emergency Medicine | Admitting: Emergency Medicine

## 2014-06-10 DIAGNOSIS — F329 Major depressive disorder, single episode, unspecified: Secondary | ICD-10-CM | POA: Diagnosis not present

## 2014-06-10 DIAGNOSIS — Z79899 Other long term (current) drug therapy: Secondary | ICD-10-CM | POA: Diagnosis not present

## 2014-06-10 DIAGNOSIS — G8929 Other chronic pain: Secondary | ICD-10-CM | POA: Diagnosis not present

## 2014-06-10 DIAGNOSIS — M5441 Lumbago with sciatica, right side: Secondary | ICD-10-CM | POA: Insufficient documentation

## 2014-06-10 DIAGNOSIS — G43909 Migraine, unspecified, not intractable, without status migrainosus: Secondary | ICD-10-CM | POA: Insufficient documentation

## 2014-06-10 DIAGNOSIS — Z720 Tobacco use: Secondary | ICD-10-CM | POA: Insufficient documentation

## 2014-06-10 DIAGNOSIS — Q439 Congenital malformation of intestine, unspecified: Secondary | ICD-10-CM | POA: Insufficient documentation

## 2014-06-10 DIAGNOSIS — Z880 Allergy status to penicillin: Secondary | ICD-10-CM | POA: Diagnosis not present

## 2014-06-10 DIAGNOSIS — J45909 Unspecified asthma, uncomplicated: Secondary | ICD-10-CM | POA: Insufficient documentation

## 2014-06-10 DIAGNOSIS — M5431 Sciatica, right side: Secondary | ICD-10-CM

## 2014-06-10 DIAGNOSIS — M546 Pain in thoracic spine: Secondary | ICD-10-CM | POA: Diagnosis present

## 2014-06-10 MED ORDER — HYDROCODONE-ACETAMINOPHEN 5-325 MG PO TABS: 1.0000 | ORAL_TABLET | ORAL | Status: DC | PRN

## 2014-06-10 NOTE — ED Notes (Signed)
Cough for 2 weeks, Pain in back and post rt thorax onset today.  Hoarse

## 2014-06-10 NOTE — Discharge Instructions (Signed)
Sciatica Sciatica is pain, weakness, numbness, or tingling along the path of the sciatic nerve. The nerve starts in the lower back and runs down the back of each leg. The nerve controls the muscles in the lower leg and in the back of the knee, while also providing sensation to the back of the thigh, lower leg, and the sole of your foot. Sciatica is a symptom of another medical condition. For instance, nerve damage or certain conditions, such as a herniated disk or bone spur on the spine, pinch or put pressure on the sciatic nerve. This causes the pain, weakness, or other sensations normally associated with sciatica. Generally, sciatica only affects one side of the body. CAUSES   Herniated or slipped disc.  Degenerative disk disease.  A pain disorder involving the narrow muscle in the buttocks (piriformis syndrome).  Pelvic injury or fracture.  Pregnancy.  Tumor (rare). SYMPTOMS  Symptoms can vary from mild to very severe. The symptoms usually travel from the low back to the buttocks and down the back of the leg. Symptoms can include:  Mild tingling or dull aches in the lower back, leg, or hip.  Numbness in the back of the calf or sole of the foot.  Burning sensations in the lower back, leg, or hip.  Sharp pains in the lower back, leg, or hip.  Leg weakness.  Severe back pain inhibiting movement. These symptoms may get worse with coughing, sneezing, laughing, or prolonged sitting or standing. Also, being overweight may worsen symptoms. DIAGNOSIS  Your caregiver will perform a physical exam to look for common symptoms of sciatica. He or she may ask you to do certain movements or activities that would trigger sciatic nerve pain. Other tests may be performed to find the cause of the sciatica. These may include:  Blood tests.  X-rays.  Imaging tests, such as an MRI or CT scan. TREATMENT  Treatment is directed at the cause of the sciatic pain. Sometimes, treatment is not necessary  and the pain and discomfort goes away on its own. If treatment is needed, your caregiver may suggest:  Over-the-counter medicines to relieve pain.  Prescription medicines, such as anti-inflammatory medicine, muscle relaxants, or narcotics.  Applying heat or ice to the painful area.  Steroid injections to lessen pain, irritation, and inflammation around the nerve.  Reducing activity during periods of pain.  Exercising and stretching to strengthen your abdomen and improve flexibility of your spine. Your caregiver may suggest losing weight if the extra weight makes the back pain worse.  Physical therapy.  Surgery to eliminate what is pressing or pinching the nerve, such as a bone spur or part of a herniated disk. HOME CARE INSTRUCTIONS   Only take over-the-counter or prescription medicines for pain or discomfort as directed by your caregiver.  Apply ice to the affected area for 20 minutes, 3-4 times a day for the first 48-72 hours. Then try heat in the same way.  Exercise, stretch, or perform your usual activities if these do not aggravate your pain.  Attend physical therapy sessions as directed by your caregiver.  Keep all follow-up appointments as directed by your caregiver.  Do not wear high heels or shoes that do not provide proper support.  Check your mattress to see if it is too soft. A firm mattress may lessen your pain and discomfort. SEEK IMMEDIATE MEDICAL CARE IF:   You lose control of your bowel or bladder (incontinence).  You have increasing weakness in the lower back, pelvis, buttocks,   or legs.  You have redness or swelling of your back.  You have a burning sensation when you urinate.  You have pain that gets worse when you lie down or awakens you at night.  Your pain is worse than you have experienced in the past.  Your pain is lasting longer than 4 weeks.  You are suddenly losing weight without reason. MAKE SURE YOU:  Understand these  instructions.  Will watch your condition.  Will get help right away if you are not doing well or get worse. Document Released: 07/20/2001 Document Revised: 01/25/2012 Document Reviewed: 12/05/2011 [employer] Patient Information 2015 [employer]. This information is not intended to replace advice given to you by your health care provider. Make sure you discuss any questions you have with your health care provider.  

## 2014-06-11 NOTE — ED Provider Notes (Signed)
CSN: [id number]     Arrival date & time 06/10/14  1815 History   First MD Initiated Contact with Patient 06/10/14 2018     Chief Complaint  Patient presents with  . Back Pain     (Consider location/radiation/quality/duration/timing/severity/associated sxs/prior Treatment) Patient is a 33 y.o. female presenting with back pain. The history is provided by the patient. No language interpreter was used.  Back Pain Location:  Thoracic spine Quality:  Aching Radiates to:  Does not radiate Pain severity:  Moderate Pain is:  Same all the time Onset quality:  Gradual Duration:  1 day Timing:  Constant Progression:  Worsening Chronicity:  New Worsened by:  Nothing tried Ineffective treatments:  None tried Associated symptoms: no fever   Risk factors: no hx of cancer   Pt reports she has been coughing for 2 weeks.  Pt reports soreness in low back with coughing today  Past Medical History  Diagnosis Date  . Depression     complex regional pain syndrome   . History of migraine headaches   . Asthma   . Complex anorectal malformation   . Chronic back pain   . Sciatica    Past Surgical History  Procedure Laterality Date  . Bilateral tubal ligation    . Gallbladder surgery    . Hand surgery    . Cholecystectomy     Family History  Problem Relation Age of Onset  . Healthy Mother     No premature cardiovascular disease or known cardiac arrhythmias   History  Substance Use Topics  . Smoking status: Current Every Day Smoker -- 1.00 packs/day    Types: Cigarettes    Last Attempt to Quit: 06/09/2010  . Smokeless tobacco: Never Used  . Alcohol Use: 0.0 oz/week     Comment: rarely   OB History    No data available     Review of Systems  Constitutional: Negative for fever.  Musculoskeletal: Positive for back pain.  All other systems reviewed and are negative.     Allergies  Penicillins and Sulfa antibiotics  Home Medications   Prior to Admission medications    Medication Sig Start Date End Date Taking? Authorizing Provider  cetirizine (ZYRTEC ALLERGY) 10 MG tablet Take 10 mg by mouth every evening.    Yes Historical Provider, MD  cyclobenzaprine (FLEXERIL) 10 MG tablet Take 10 mg by mouth at bedtime.   Yes Historical Provider, MD  FLUoxetine (PROZAC) 20 MG capsule Take 20 mg by mouth daily.   Yes Historical Provider, MD  levonorgestrel (MIRENA) 20 MCG/24HR IUD 1 each by Intrauterine route once.   Yes Historical Provider, MD  omeprazole (PRILOSEC) 20 MG capsule Take 20 mg by mouth every morning.    Yes Historical Provider, MD  HYDROcodone-acetaminophen (NORCO/VICODIN) 5-325 MG per tablet Take 1 tablet by mouth every 4 (four) hours as needed. 06/10/14   Elson AreasLeslie K Ahmani Daoud, PA-C  oxyCODONE-acetaminophen (PERCOCET/ROXICET) 5-325 MG per tablet Take 1 tablet by mouth every 4 (four) hours as needed. Patient not taking: Reported on 06/10/2014 05/05/14   Tammy L. Triplett, PA-C  predniSONE (DELTASONE) 10 MG tablet Take 10-60 mg by mouth 3 (three) times daily. 6, 5, 4, 3, 2 then 1 tablet by mouth daily for 6 days total. Patient has broken the dosages down to 3 times per day. 04/29/14   Burgess AmorJulie Idol, PA-C  pseudoephedrine (SUDAFED) 30 MG tablet Take 30 mg by mouth every 4 (four) hours as needed for congestion.    Historical Provider, MD  BP 131/86 mmHg  Pulse 65  Temp(Src) 98.3 F (36.8 C) (Oral)  Resp 18  Ht 5\' 6"  (1.676 m)  Wt 266 lb (120.657 kg)  BMI 42.95 kg/m2  SpO2 97% Physical Exam  Constitutional: She appears well-developed and well-nourished.  HENT:  Head: Normocephalic and atraumatic.  Eyes: Pupils are equal, round, and reactive to light.  Neck: Normal range of motion.  Cardiovascular: Normal rate.   Pulmonary/Chest: Effort normal and breath sounds normal.  Abdominal: Soft.  Musculoskeletal:  Diffusely tender thoracic muscles.  Spine nontender,  Neurological: She is alert.  Skin: Skin is warm.  Psychiatric: She has a normal mood and affect.   Nursing note and vitals reviewed.   ED Course  Procedures (including critical care time) Labs Review Labs Reviewed - No data to display  Imaging Review No results found.   EKG Interpretation None       Pt given rx for hydrocodone  Pt advised to follow up with her MD for recheck.   I suspect viral uri with exacerabation of back pain second to coughing   Final diagnoses:  Back pain with right-sided sciatica        Elson AreasLeslie K Daulton Harbaugh, PA-C 06/11/14 0036

## 2014-09-01 ENCOUNTER — Encounter (HOSPITAL_COMMUNITY): Payer: Self-pay | Admitting: *Deleted

## 2014-09-01 ENCOUNTER — Emergency Department (HOSPITAL_COMMUNITY)
Admission: EM | Admit: 2014-09-01 | Discharge: 2014-09-01 | Disposition: A | Discharge status: Home / Self Care | Payer: Medicaid Other | Attending: Emergency Medicine | Admitting: Emergency Medicine

## 2014-09-01 DIAGNOSIS — Z720 Tobacco use: Secondary | ICD-10-CM | POA: Insufficient documentation

## 2014-09-01 DIAGNOSIS — J069 Acute upper respiratory infection, unspecified: Secondary | ICD-10-CM | POA: Diagnosis not present

## 2014-09-01 DIAGNOSIS — M5416 Radiculopathy, lumbar region: Secondary | ICD-10-CM | POA: Insufficient documentation

## 2014-09-01 DIAGNOSIS — R197 Diarrhea, unspecified: Secondary | ICD-10-CM | POA: Insufficient documentation

## 2014-09-01 DIAGNOSIS — R05 Cough: Secondary | ICD-10-CM | POA: Diagnosis present

## 2014-09-01 DIAGNOSIS — J45909 Unspecified asthma, uncomplicated: Secondary | ICD-10-CM | POA: Insufficient documentation

## 2014-09-01 DIAGNOSIS — Z8679 Personal history of other diseases of the circulatory system: Secondary | ICD-10-CM | POA: Insufficient documentation

## 2014-09-01 DIAGNOSIS — Z79899 Other long term (current) drug therapy: Secondary | ICD-10-CM | POA: Insufficient documentation

## 2014-09-01 DIAGNOSIS — Q439 Congenital malformation of intestine, unspecified: Secondary | ICD-10-CM | POA: Insufficient documentation

## 2014-09-01 DIAGNOSIS — Z880 Allergy status to penicillin: Secondary | ICD-10-CM | POA: Insufficient documentation

## 2014-09-01 DIAGNOSIS — Z7952 Long term (current) use of systemic steroids: Secondary | ICD-10-CM | POA: Diagnosis not present

## 2014-09-01 DIAGNOSIS — F329 Major depressive disorder, single episode, unspecified: Secondary | ICD-10-CM | POA: Insufficient documentation

## 2014-09-01 DIAGNOSIS — G8929 Other chronic pain: Secondary | ICD-10-CM | POA: Insufficient documentation

## 2014-09-01 DIAGNOSIS — M5410 Radiculopathy, site unspecified: Secondary | ICD-10-CM

## 2014-09-01 MED ORDER — HYDROCOD POLST-CHLORPHEN POLST 10-8 MG/5ML PO LQCR: 5.0000 mL | Freq: Two times a day (BID) | ORAL | Status: DC | PRN

## 2014-09-01 MED ORDER — PREDNISONE 10 MG PO TABS: 20.0000 mg | ORAL_TABLET | Freq: Two times a day (BID) | ORAL | Status: DC

## 2014-09-01 NOTE — Discharge Instructions (Signed)
Prednisone as prescribed.  Tussionex as prescribed as needed for cough. This may also help with your back pain.  Follow-up with your primary Dr. if not improving in the next week.   Upper Respiratory Infection, Adult An upper respiratory infection (URI) is also sometimes known as the common cold. The upper respiratory tract includes the nose, sinuses, throat, trachea, and bronchi. Bronchi are the airways leading to the lungs. Most people improve within 1 week, but symptoms can last up to 2 weeks. A residual cough may last even longer.  CAUSES Many different viruses can infect the tissues lining the upper respiratory tract. The tissues become irritated and inflamed and often become very moist. Mucus production is also common. A cold is contagious. You can easily spread the virus to others by oral contact. This includes kissing, sharing a glass, coughing, or sneezing. Touching your mouth or nose and then touching a surface, which is then touched by another person, can also spread the virus. SYMPTOMS  Symptoms typically develop 1 to 3 days after you come in contact with a cold virus. Symptoms vary from person to person. They may include:  Runny nose.  Sneezing.  Nasal congestion.  Sinus irritation.  Sore throat.  Loss of voice (laryngitis).  Cough.  Fatigue.  Muscle aches.  Loss of appetite.  Headache.  Low-grade fever. DIAGNOSIS  You might diagnose your own cold based on familiar symptoms, since most people get a cold 2 to 3 times a year. Your caregiver can confirm this based on your exam. Most importantly, your caregiver can check that your symptoms are not due to another disease such as strep throat, sinusitis, pneumonia, asthma, or epiglottitis. Blood tests, throat tests, and X-rays are not necessary to diagnose a common cold, but they may sometimes be helpful in excluding other more serious diseases. Your caregiver will decide if any further tests are required. RISKS AND  COMPLICATIONS  You may be at risk for a more severe case of the common cold if you smoke cigarettes, have chronic heart disease (such as heart failure) or lung disease (such as asthma), or if you have a weakened immune system. The very young and very old are also at risk for more serious infections. Bacterial sinusitis, middle ear infections, and bacterial pneumonia can complicate the common cold. The common cold can worsen asthma and chronic obstructive pulmonary disease (COPD). Sometimes, these complications can require emergency medical care and may be life-threatening. PREVENTION  The best way to protect against getting a cold is to practice good hygiene. Avoid oral or hand contact with people with cold symptoms. Wash your hands often if contact occurs. There is no clear evidence that vitamin C, vitamin E, echinacea, or exercise reduces the chance of developing a cold. However, it is always recommended to get plenty of rest and practice good nutrition. TREATMENT  Treatment is directed at relieving symptoms. There is no cure. Antibiotics are not effective, because the infection is caused by a virus, not by bacteria. Treatment may include:  Increased fluid intake. Sports drinks offer valuable electrolytes, sugars, and fluids.  Breathing heated mist or steam (vaporizer or shower).  Eating chicken soup or other clear broths, and maintaining good nutrition.  Getting plenty of rest.  Using gargles or lozenges for comfort.  Controlling fevers with ibuprofen or acetaminophen as directed by your caregiver.  Increasing usage of your inhaler if you have asthma. Zinc gel and zinc lozenges, taken in the first 24 hours of the common cold, can shorten  the duration and lessen the severity of symptoms. Pain medicines may help with fever, muscle aches, and throat pain. A variety of non-prescription medicines are available to treat congestion and runny nose. Your caregiver can make recommendations and may  suggest nasal or lung inhalers for other symptoms.  HOME CARE INSTRUCTIONS   Only take over-the-counter or prescription medicines for pain, discomfort, or fever as directed by your caregiver.  Use a warm mist humidifier or inhale steam from a shower to increase air moisture. This may keep secretions moist and make it easier to breathe.  Drink enough water and fluids to keep your urine clear or pale yellow.  Rest as needed.  Return to work when your temperature has returned to normal or as your caregiver advises. You may need to stay home longer to avoid infecting others. You can also use a face mask and careful hand washing to prevent spread of the virus. SEEK MEDICAL CARE IF:   After the first few days, you feel you are getting worse rather than better.  You need your caregiver's advice about medicines to control symptoms.  You develop chills, worsening shortness of breath, or brown or red sputum. These may be signs of pneumonia.  You develop yellow or brown nasal discharge or pain in the face, especially when you bend forward. These may be signs of sinusitis.  You develop a fever, swollen neck glands, pain with swallowing, or white areas in the back of your throat. These may be signs of strep throat. SEEK IMMEDIATE MEDICAL CARE IF:   You have a fever.  You develop severe or persistent headache, ear pain, sinus pain, or chest pain.  You develop wheezing, a prolonged cough, cough up blood, or have a change in your usual mucus (if you have chronic lung disease).  You develop sore muscles or a stiff neck. Document Released: 01/19/2001 Document Revised: 10/18/2011 Document Reviewed: 10/31/2013 [facility] Patient Information 2015 [place], [place]. This information is not intended to replace advice given to you by your health care provider. Make sure you discuss any questions you have with your health care provider.  Lumbosacral Radiculopathy Lumbosacral radiculopathy is a pinched nerve  or nerves in the low back (lumbosacral area). When this happens you may have weakness in your legs and may not be able to stand on your toes. You may have pain going down into your legs. There may be difficulties with walking normally. There are many causes of this problem. Sometimes this may happen from an injury, or simply from arthritis or boney problems. It may also be caused by other illnesses such as diabetes. If there is no improvement after treatment, further studies may be done to find the exact cause. DIAGNOSIS  X-rays may be needed if the problems become long standing. Electromyograms may be done. This study is one in which the working of nerves and muscles is studied. HOME CARE INSTRUCTIONS   Applications of ice packs may be helpful. Ice can be used in a plastic bag with a towel around it to prevent frostbite to skin. This may be used every 2 hours for 20 to 30 minutes, or as needed, while awake, or as directed by your caregiver.  Only take over-the-counter or prescription medicines for pain, discomfort, or fever as directed by your caregiver.  If physical therapy was prescribed, follow your caregiver's directions. SEEK IMMEDIATE MEDICAL CARE IF:   You have pain not controlled with medications.  You seem to be getting worse rather than better.  You develop increasing weakness in your legs.  You develop loss of bowel or bladder control.  You have difficulty with walking or balance, or develop clumsiness in the use of your legs.  You have a fever. MAKE SURE YOU:   Understand these instructions.  Will watch your condition.  Will get help right away if you are not doing well or get worse. Document Released: 07/26/2005 Document Revised: 10/18/2011 Document Reviewed: 03/15/2008 [facility] Patient Information 2015 [place], [place]. This information is not intended to replace advice given to you by your health care provider. Make sure you discuss any questions you have with your  health care provider.

## 2014-09-01 NOTE — ED Provider Notes (Signed)
CSN: [id number]     Arrival date & time 09/01/14  2208 History   This chart was scribed for Geoffery Lyons, MD by Evon Slack, ED Scribe. This patient was seen in room APA10/APA10 and the patient's care was started at 10:25 PM.      Chief Complaint  Patient presents with  . Cough   HPI HPI Comments: Pamela Duran is a 34 y.o. female with PMHx chronic back pain and sciatica who presents to the Emergency Department complaining of dry cough onset 1 week prior. Pt states that she intermittently produces clear sputum. Pt states she has tried OTC medications with no relief. Pt is also complaining of low back pain that radiates down into her right leg. Pt states she has had diarrhea as well that's unrelated to her symptoms. Pt doesn't report any other symptoms.   Past Medical History  Diagnosis Date  . Depression     complex regional pain syndrome   . History of migraine headaches   . Asthma   . Complex anorectal malformation   . Chronic back pain   . Sciatica    Past Surgical History  Procedure Laterality Date  . Bilateral tubal ligation    . Gallbladder surgery    . Hand surgery    . Cholecystectomy     Family History  Problem Relation Age of Onset  . Healthy Mother     No premature cardiovascular disease or known cardiac arrhythmias   History  Substance Use Topics  . Smoking status: Current Every Day Smoker -- 1.00 packs/day    Types: Cigarettes    Last Attempt to Quit: 06/09/2010  . Smokeless tobacco: Never Used  . Alcohol Use: 0.0 oz/week     Comment: rarely   OB History    No data available     Review of Systems  Constitutional: Negative for fever.  Respiratory: Positive for cough.   Gastrointestinal: Positive for diarrhea.  Musculoskeletal: Positive for back pain.  All other systems reviewed and are negative.     Allergies  Penicillins and Sulfa antibiotics  Home Medications   Prior to Admission medications   Medication Sig Start Date End Date  Taking? Authorizing Provider  cetirizine (ZYRTEC ALLERGY) 10 MG tablet Take 10 mg by mouth every evening.     Historical Provider, MD  cyclobenzaprine (FLEXERIL) 10 MG tablet Take 10 mg by mouth at bedtime.    Historical Provider, MD  FLUoxetine (PROZAC) 20 MG capsule Take 20 mg by mouth daily.    Historical Provider, MD  HYDROcodone-acetaminophen (NORCO/VICODIN) 5-325 MG per tablet Take 1 tablet by mouth every 4 (four) hours as needed. 06/10/14   Elson Areas, PA-C  levonorgestrel (MIRENA) 20 MCG/24HR IUD 1 each by Intrauterine route once.    Historical Provider, MD  omeprazole (PRILOSEC) 20 MG capsule Take 20 mg by mouth every morning.     Historical Provider, MD  oxyCODONE-acetaminophen (PERCOCET/ROXICET) 5-325 MG per tablet Take 1 tablet by mouth every 4 (four) hours as needed. Patient not taking: Reported on 06/10/2014 05/05/14   Tammy L. Triplett, PA-C  predniSONE (DELTASONE) 10 MG tablet Take 10-60 mg by mouth 3 (three) times daily. 6, 5, 4, 3, 2 then 1 tablet by mouth daily for 6 days total. Patient has broken the dosages down to 3 times per day. 04/29/14   Burgess Amor, PA-C  pseudoephedrine (SUDAFED) 30 MG tablet Take 30 mg by mouth every 4 (four) hours as needed for congestion.    Historical  Provider, MD   BP 112/69 mmHg  Pulse 79  Temp(Src) 99 F (37.2 C) (Oral)  Resp 20  Ht 5\' 7"  (1.702 m)  Wt 253 lb (114.76 kg)  BMI 39.62 kg/m2  SpO2 100%   Physical Exam  Constitutional: She is oriented to person, place, and time. She appears well-developed and well-nourished. No distress.  HENT:  Head: Normocephalic and atraumatic.  Eyes: Conjunctivae and EOM are normal.  Neck: Neck supple. No tracheal deviation present.  Cardiovascular: Normal rate, regular rhythm and normal heart sounds.   Pulmonary/Chest: Effort normal. No respiratory distress. She has no wheezes. She has no rales.  Musculoskeletal: Normal range of motion.  TTP of soft tissues of the lumbar region.   Neurological: She is  alert and oriented to person, place, and time.  Skin: Skin is warm and dry.  Psychiatric: She has a normal mood and affect. Her behavior is normal.  Nursing note and vitals reviewed.   ED Course  Procedures (including critical care time) DIAGNOSTIC STUDIES: Oxygen Saturation is 100% on RA, normal by my interpretation.    COORDINATION OF CARE: 10:29 PM-Discussed treatment plan with pt at bedside and pt agreed to plan.     Labs Review Labs Reviewed - No data to display  Imaging Review No results found.   EKG Interpretation None      MDM   Final diagnoses:  None      Patient with radicular low back pain and cough for the past week. She will be treated with cough syrup and prednisone. This should help both her cough and her back discomfort. There are no red flags to suggest an emergent situation. Her strength and reflexes are symmetrical and there are no bowel or bladder complaints.   I personally performed the services described in this documentation, which was scribed in my presence. The recorded information has been reviewed and is accurate.       Geoffery Lyonsouglas Kynslie Ringle, MD 09/03/14 [phone]

## 2014-09-01 NOTE — ED Notes (Signed)
Pt c/o cough x 1 week and states she is having lower back pain with the pain radiating down her right leg

## 2014-10-29 ENCOUNTER — Emergency Department (HOSPITAL_COMMUNITY)
Admission: EM | Admit: 2014-10-29 | Discharge: 2014-10-29 | Disposition: A | Discharge status: Home / Self Care | Payer: Medicaid Other | Attending: Emergency Medicine | Admitting: Emergency Medicine

## 2014-10-29 ENCOUNTER — Encounter (HOSPITAL_COMMUNITY): Payer: Self-pay | Admitting: *Deleted

## 2014-10-29 ENCOUNTER — Emergency Department (HOSPITAL_COMMUNITY): Payer: Medicaid Other

## 2014-10-29 DIAGNOSIS — Z23 Encounter for immunization: Secondary | ICD-10-CM | POA: Insufficient documentation

## 2014-10-29 DIAGNOSIS — Y998 Other external cause status: Secondary | ICD-10-CM | POA: Diagnosis not present

## 2014-10-29 DIAGNOSIS — Z7952 Long term (current) use of systemic steroids: Secondary | ICD-10-CM | POA: Diagnosis not present

## 2014-10-29 DIAGNOSIS — Z720 Tobacco use: Secondary | ICD-10-CM | POA: Diagnosis not present

## 2014-10-29 DIAGNOSIS — Y9389 Activity, other specified: Secondary | ICD-10-CM | POA: Insufficient documentation

## 2014-10-29 DIAGNOSIS — J45909 Unspecified asthma, uncomplicated: Secondary | ICD-10-CM | POA: Diagnosis not present

## 2014-10-29 DIAGNOSIS — W228XXA Striking against or struck by other objects, initial encounter: Secondary | ICD-10-CM | POA: Insufficient documentation

## 2014-10-29 DIAGNOSIS — S60221A Contusion of right hand, initial encounter: Secondary | ICD-10-CM | POA: Diagnosis not present

## 2014-10-29 DIAGNOSIS — S6991XA Unspecified injury of right wrist, hand and finger(s), initial encounter: Secondary | ICD-10-CM | POA: Diagnosis present

## 2014-10-29 DIAGNOSIS — Q439 Congenital malformation of intestine, unspecified: Secondary | ICD-10-CM | POA: Diagnosis not present

## 2014-10-29 DIAGNOSIS — Z880 Allergy status to penicillin: Secondary | ICD-10-CM | POA: Diagnosis not present

## 2014-10-29 DIAGNOSIS — F329 Major depressive disorder, single episode, unspecified: Secondary | ICD-10-CM | POA: Insufficient documentation

## 2014-10-29 DIAGNOSIS — G8929 Other chronic pain: Secondary | ICD-10-CM | POA: Insufficient documentation

## 2014-10-29 DIAGNOSIS — Y9289 Other specified places as the place of occurrence of the external cause: Secondary | ICD-10-CM | POA: Diagnosis not present

## 2014-10-29 DIAGNOSIS — Z79899 Other long term (current) drug therapy: Secondary | ICD-10-CM | POA: Diagnosis not present

## 2014-10-29 MED ORDER — TETANUS-DIPHTH-ACELL PERTUSSIS 5-2.5-18.5 LF-MCG/0.5 IM SUSP
0.5000 mL | Freq: Once | INTRAMUSCULAR | Status: AC
  Administered 2014-10-29: 0.5 mL via INTRAMUSCULAR
  Filled 2014-10-29: qty 0.5

## 2014-10-29 MED ORDER — OXYCODONE-ACETAMINOPHEN 5-325 MG PO TABS: 1.0000 | ORAL_TABLET | ORAL | Status: DC | PRN

## 2014-10-29 NOTE — ED Notes (Signed)
Rt hand closed in car door yesterday . Abrasions present

## 2014-10-29 NOTE — Discharge Instructions (Signed)
Contusion °A contusion is a deep bruise. Contusions are the result of an injury that caused bleeding under the skin. The contusion may turn blue, purple, or yellow. Minor injuries will give you a painless contusion, but more severe contusions may stay painful and swollen for a few weeks.  °CAUSES  °A contusion is usually caused by a blow, trauma, or direct force to an area of the body. °SYMPTOMS  °· Swelling and redness of the injured area. °· Bruising of the injured area. °· Tenderness and soreness of the injured area. °· Pain. °DIAGNOSIS  °The diagnosis can be made by taking a history and physical exam. An X-ray, CT scan, or MRI may be needed to determine if there were any associated injuries, such as fractures. °TREATMENT  °Specific treatment will depend on what area of the body was injured. In general, the best treatment for a contusion is resting, icing, elevating, and applying cold compresses to the injured area. Over-the-counter medicines may also be recommended for pain control. Ask your caregiver what the best treatment is for your contusion. °HOME CARE INSTRUCTIONS  °· Put ice on the injured area. °¨ Put ice in a plastic bag. °¨ Place a towel between your skin and the bag. °¨ Leave the ice on for 15-20 minutes, 3-4 times a day, or as directed by your health care provider. °· Only take over-the-counter or prescription medicines for pain, discomfort, or fever as directed by your caregiver. Your caregiver may recommend avoiding anti-inflammatory medicines (aspirin, ibuprofen, and naproxen) for 48 hours because these medicines may increase bruising. °· Rest the injured area. °· If possible, elevate the injured area to reduce swelling. °SEEK IMMEDIATE MEDICAL CARE IF:  °· You have increased bruising or swelling. °· You have pain that is getting worse. °· Your swelling or pain is not relieved with medicines. °MAKE SURE YOU:  °· Understand these instructions. °· Will watch your condition. °· Will get help right  away if you are not doing well or get worse. °Document Released: 05/05/2005 Document Revised: 07/31/2013 Document Reviewed: 05/31/2011 °[employer]® Patient Information ©2015 [employer]. This information is not intended to replace advice given to you by your health care provider. Make sure you discuss any questions you have with your health care provider. ° °

## 2014-10-30 NOTE — ED Provider Notes (Signed)
CSN: [id number]     Arrival date & time 10/29/14  1234 History   First MD Initiated Contact with Patient 10/29/14 1407     Chief Complaint  Patient presents with  . Hand Pain     (Consider location/radiation/quality/duration/timing/severity/associated sxs/prior Treatment) The history is provided by the patient.   Pamela Duran is a 34 y.o. right handed female presenting with pain and swelling across the knuckles of her right hand since her niece accidentally shut the the car door on it yesterday.  She reports it hurts worse today then yesterday, woke with increased swelling and a numb patch of skin on her dorsal hand.  She denies fingertip numbness.  She is able to flex and extend the fingers with increased pain. She has applied ice and took ibuprofen and hydrocodone tablet left over from a prior prescription with partial pain response. Her tetanus is out of date.    Past Medical History  Diagnosis Date  . Depression     complex regional pain syndrome   . History of migraine headaches   . Asthma   . Complex anorectal malformation   . Chronic back pain   . Sciatica    Past Surgical History  Procedure Laterality Date  . Bilateral tubal ligation    . Gallbladder surgery    . Hand surgery    . Cholecystectomy     Family History  Problem Relation Age of Onset  . Healthy Mother     No premature cardiovascular disease or known cardiac arrhythmias   History  Substance Use Topics  . Smoking status: Current Every Day Smoker -- 1.00 packs/day    Types: Cigarettes    Last Attempt to Quit: 06/09/2010  . Smokeless tobacco: Never Used  . Alcohol Use: 0.0 oz/week     Comment: rarely   OB History    No data available     Review of Systems  Constitutional: Negative for fever.  Musculoskeletal: Positive for joint swelling and arthralgias. Negative for myalgias.  Skin: Positive for wound.  Neurological: Positive for numbness. Negative for weakness.      Allergies   Penicillins and Sulfa antibiotics  Home Medications   Prior to Admission medications   Medication Sig Start Date End Date Taking? Authorizing Provider  cetirizine (ZYRTEC ALLERGY) 10 MG tablet Take 10 mg by mouth every evening.     Historical Provider, MD  chlorpheniramine-HYDROcodone (TUSSIONEX PENNKINETIC ER) 10-8 MG/5ML LQCR Take 5 mLs by mouth every 12 (twelve) hours as needed for cough. 09/01/14   Geoffery Lyonsouglas Delo, MD  cyclobenzaprine (FLEXERIL) 10 MG tablet Take 10 mg by mouth at bedtime.    Historical Provider, MD  FLUoxetine (PROZAC) 20 MG capsule Take 20 mg by mouth daily.    Historical Provider, MD  HYDROcodone-acetaminophen (NORCO/VICODIN) 5-325 MG per tablet Take 1 tablet by mouth every 4 (four) hours as needed. 06/10/14   Elson AreasLeslie K Sofia, PA-C  levonorgestrel (MIRENA) 20 MCG/24HR IUD 1 each by Intrauterine route once.    Historical Provider, MD  omeprazole (PRILOSEC) 20 MG capsule Take 20 mg by mouth every morning.     Historical Provider, MD  oxyCODONE-acetaminophen (PERCOCET/ROXICET) 5-325 MG per tablet Take 1 tablet by mouth every 4 (four) hours as needed. 10/29/14   Burgess AmorJulie Keylen Uzelac, PA-C  predniSONE (DELTASONE) 10 MG tablet Take 2 tablets (20 mg total) by mouth 2 (two) times daily. 09/01/14   Geoffery Lyonsouglas Delo, MD  pseudoephedrine (SUDAFED) 30 MG tablet Take 30 mg by mouth every 4 (four)  hours as needed for congestion.    Historical Provider, MD   BP 111/74 mmHg  Pulse 70  Temp(Src) 98.4 F (36.9 C) (Oral)  Resp 20  Ht  (1.702 m)  Wt 253 lb (114.76 kg)  BMI 39.62 kg/m2  SpO2 97% Physical Exam  Constitutional: She appears well-developed and well-nourished.  HENT:  Head: Atraumatic.  Neck: Normal range of motion.  Cardiovascular:  Pulses equal bilaterally  Musculoskeletal: She exhibits tenderness.       Right hand: She exhibits tenderness. She exhibits normal capillary refill and no deformity. Normal strength noted.  Pt is ttp with mild edema over the 2nd, 3rd and 4th mcp joint,  each with a small scabbed abrasion. No palpable deformity. Finger sensation intact with less than 2 sec distal cap refill. Small circle of parasthesia dorsally over 4th and 5th mc's. Pt can flex/ext digits.  Neurological: She is alert. She has normal strength. She displays normal reflexes. No sensory deficit.  Skin: Skin is warm and dry.  Psychiatric: She has a normal mood and affect.    ED Course  Procedures (including critical care time) Labs Review Labs Reviewed - No data to display  Imaging Review Dg Hand Complete Right  10/29/2014   CLINICAL DATA:  Acute right hand pain, car door injury. Fourth and fifth fingers injured.  EXAM: RIGHT HAND - COMPLETE 3+ VIEW  COMPARISON:  None.  FINDINGS: Healed right fifth metacarpal fracture noted. Normal alignment without acute fracture. No significant arthropathy. No soft tissue abnormality or radiopaque foreign body.  IMPRESSION: Healed fracture of the right fifth metacarpal.  No acute osseous finding.   Electronically Signed   By: Judie Petit.  Shick M.D.   On: 10/29/2014 13:53     EKG Interpretation None      MDM   Final diagnoses:  Hand contusion, right, initial encounter    Patients labs and/or radiological studies were reviewed and considered during the medical decision making and disposition process.  Results were also discussed with patient. Ace wrap provided.  RICE, continue ibuprofen, oxycodone prescribed. F/u with pcp prn if sx persist.  Tetanus updated.    Burgess Amor, PA-C 10/30/14 1610  Pricilla Loveless, MD 11/02/14 [phone]

## 2014-11-15 ENCOUNTER — Encounter (HOSPITAL_COMMUNITY): Payer: Self-pay | Admitting: *Deleted

## 2014-11-15 ENCOUNTER — Emergency Department (HOSPITAL_COMMUNITY)
Admission: EM | Admit: 2014-11-15 | Discharge: 2014-11-15 | Disposition: A | Discharge status: Home / Self Care | Payer: Medicaid Other | Attending: Emergency Medicine | Admitting: Emergency Medicine

## 2014-11-15 DIAGNOSIS — M545 Low back pain, unspecified: Secondary | ICD-10-CM

## 2014-11-15 DIAGNOSIS — Z79899 Other long term (current) drug therapy: Secondary | ICD-10-CM | POA: Diagnosis not present

## 2014-11-15 DIAGNOSIS — Q439 Congenital malformation of intestine, unspecified: Secondary | ICD-10-CM | POA: Diagnosis not present

## 2014-11-15 DIAGNOSIS — F329 Major depressive disorder, single episode, unspecified: Secondary | ICD-10-CM | POA: Diagnosis not present

## 2014-11-15 DIAGNOSIS — J45901 Unspecified asthma with (acute) exacerbation: Secondary | ICD-10-CM | POA: Insufficient documentation

## 2014-11-15 DIAGNOSIS — Z720 Tobacco use: Secondary | ICD-10-CM | POA: Diagnosis not present

## 2014-11-15 DIAGNOSIS — G8929 Other chronic pain: Secondary | ICD-10-CM | POA: Insufficient documentation

## 2014-11-15 DIAGNOSIS — Z880 Allergy status to penicillin: Secondary | ICD-10-CM | POA: Insufficient documentation

## 2014-11-15 DIAGNOSIS — J209 Acute bronchitis, unspecified: Secondary | ICD-10-CM

## 2014-11-15 DIAGNOSIS — R05 Cough: Secondary | ICD-10-CM | POA: Diagnosis present

## 2014-11-15 MED ORDER — ALBUTEROL SULFATE HFA 108 (90 BASE) MCG/ACT IN AERS: 1.0000 | INHALATION_SPRAY | Freq: Four times a day (QID) | RESPIRATORY_TRACT | Status: DC | PRN

## 2014-11-15 MED ORDER — PREDNISONE 10 MG PO TABS: ORAL_TABLET | ORAL | Status: DC

## 2014-11-15 MED ORDER — PROMETHAZINE-CODEINE 6.25-10 MG/5ML PO SYRP: 5.0000 mL | ORAL_SOLUTION | ORAL | Status: DC | PRN

## 2014-11-15 NOTE — ED Notes (Signed)
Patient given discharge instruction, verbalized understand. Patient ambulatory out of the department.  

## 2014-11-15 NOTE — ED Provider Notes (Signed)
CSN: [id number]     Arrival date & time 11/15/14  1050 History   First MD Initiated Contact with Patient 11/15/14 1054     Chief Complaint  Patient presents with  . Cough     (Consider location/radiation/quality/duration/timing/severity/associated sxs/prior Treatment) The history is provided by the patient.   Pamela Duran is a 34 y.o. female presenting with a 2 week history of cough associated with green sputum production, nasal congestion also with green and sometimes clear drainage, postnasal drip and generalized headache.  She denies fevers or chills, she does have wheezing, mostly at night.  She does have midsternal chest pain described as a burning sensation with coughing only.  She was seen at [facility] 2 days ago for these symptoms and was diagnosed with acute bronchitis.  She had a chest x-ray at that time which was negative for pneumonia.  She was placed on Zithromax and prescribed Tessalon pearls which have not improved her cough frequency.  She also endorses acute on chronic low back pain  which she suspects is secondary to the coughing.  She denies urinary frequency, dysuria, and also denies weakness or numbness in her lower extremities.    Past Medical History  Diagnosis Date  . Depression     complex regional pain syndrome   . History of migraine headaches   . Asthma   . Complex anorectal malformation   . Chronic back pain   . Sciatica    Past Surgical History  Procedure Laterality Date  . Bilateral tubal ligation    . Gallbladder surgery    . Hand surgery    . Cholecystectomy     Family History  Problem Relation Age of Onset  . Healthy Mother     No premature cardiovascular disease or known cardiac arrhythmias   History  Substance Use Topics  . Smoking status: Current Every Day Smoker -- 1.00 packs/day    Types: Cigarettes    Last Attempt to Quit: 06/09/2010  . Smokeless tobacco: Never Used  . Alcohol Use: 0.0 oz/week     Comment: rarely   OB  History    No data available     Review of Systems  Constitutional: Positive for fever and chills.  HENT: Positive for congestion, postnasal drip, rhinorrhea, sinus pressure and sore throat. Negative for ear pain, trouble swallowing and voice change.   Eyes: Negative for discharge.  Respiratory: Positive for cough and wheezing. Negative for shortness of breath and stridor.   Cardiovascular: Negative for chest pain.  Gastrointestinal: Negative for abdominal pain.  Genitourinary: Negative.   Musculoskeletal: Positive for back pain.      Allergies  Penicillins and Sulfa antibiotics  Home Medications   Prior to Admission medications   Medication Sig Start Date End Date Taking? Authorizing Provider  albuterol (PROVENTIL HFA;VENTOLIN HFA) 108 (90 BASE) MCG/ACT inhaler Inhale 1-2 puffs into the lungs every 6 (six) hours as needed for wheezing or shortness of breath. 11/15/14   Burgess Amor, PA-C  cetirizine (ZYRTEC ALLERGY) 10 MG tablet Take 10 mg by mouth every evening.     Historical Provider, MD  chlorpheniramine-HYDROcodone (TUSSIONEX PENNKINETIC ER) 10-8 MG/5ML LQCR Take 5 mLs by mouth every 12 (twelve) hours as needed for cough. 09/01/14   Geoffery Lyons, MD  cyclobenzaprine (FLEXERIL) 10 MG tablet Take 10 mg by mouth at bedtime.    Historical Provider, MD  FLUoxetine (PROZAC) 20 MG capsule Take 20 mg by mouth daily.    Historical Provider, MD  HYDROcodone-acetaminophen (  NORCO/VICODIN) 5-325 MG per tablet Take 1 tablet by mouth every 4 (four) hours as needed. 06/10/14   Elson Areas, PA-C  levonorgestrel (MIRENA) 20 MCG/24HR IUD 1 each by Intrauterine route once.    Historical Provider, MD  omeprazole (PRILOSEC) 20 MG capsule Take 20 mg by mouth every morning.     Historical Provider, MD  oxyCODONE-acetaminophen (PERCOCET/ROXICET) 5-325 MG per tablet Take 1 tablet by mouth every 4 (four) hours as needed. 10/29/14   Burgess Amor, PA-C  predniSONE (DELTASONE) 10 MG tablet 6, 5, 4, 3, 2 then 1  tablet by mouth daily for 6 days total. 11/15/14   Burgess Amor, PA-C  promethazine-codeine (PHENERGAN WITH CODEINE) 6.25-10 MG/5ML syrup Take 5 mLs by mouth every 4 (four) hours as needed for cough. 11/15/14   Burgess Amor, PA-C  pseudoephedrine (SUDAFED) 30 MG tablet Take 30 mg by mouth every 4 (four) hours as needed for congestion.    Historical Provider, MD   BP 110/70 mmHg  Pulse 61  Temp(Src) 98 F (36.7 C) (Oral)  Resp 20  Ht  (1.702 m)  Wt 253 lb (114.76 kg)  BMI 39.62 kg/m2  SpO2 100% Physical Exam  Constitutional: She is oriented to person, place, and time. She appears well-developed and well-nourished.  HENT:  Head: Normocephalic and atraumatic.  Right Ear: Tympanic membrane and ear canal normal.  Left Ear: Tympanic membrane and ear canal normal.  Nose: Mucosal edema and rhinorrhea present.  Mouth/Throat: Uvula is midline, oropharynx is clear and moist and mucous membranes are normal. No oropharyngeal exudate, posterior oropharyngeal edema, posterior oropharyngeal erythema or tonsillar abscesses.  Eyes: Conjunctivae are normal.  Neck: Normal range of motion. Neck supple.  Cardiovascular: Normal rate, normal heart sounds and intact distal pulses.   Pedal pulses normal.  Pulmonary/Chest: Effort normal. No respiratory distress. She has no wheezes. She has no rhonchi. She has no rales.  Abdominal: Soft. Bowel sounds are normal.  Musculoskeletal: Normal range of motion. She exhibits no edema.       Lumbar back: She exhibits tenderness. She exhibits no swelling, no edema and no spasm.  Neurological: She is alert and oriented to person, place, and time. She has normal strength. She displays no atrophy and no tremor. No sensory deficit. Gait normal.  Reflex Scores:      Patellar reflexes are 2+ on the right side and 2+ on the left side.      Achilles reflexes are 2+ on the right side and 2+ on the left side. No strength deficit noted in hip and knee flexor and extensor muscle groups.   Ankle flexion and extension intact.  Skin: Skin is warm and dry. No rash noted.  Psychiatric: She has a normal mood and affect.  Nursing note and vitals reviewed.   ED Course  Procedures (including critical care time) Labs Review Labs Reviewed - No data to display  Imaging Review No results found.   EKG Interpretation None      MDM   Final diagnoses:  Bronchitis with bronchospasm  Midline low back pain without sciatica    Chest x-ray from [facility] 2 days ago reviewed with result pasted below.  Patient was encouraged to complete her Z-Pak.  She was placed on albuterol MDI 2 puffs every 4 hours for cough or wheezing.  Prednisone taper also for wheezing.  Phenergan With Codeine for cough suppression, back pain relief.  Encouraged rest, increase fluid intake.  Follow-up with her PCP for recheck of her symptoms  if not improved with these medications.  The patient appears reasonably screened and/or stabilized for discharge and I doubt any other medical condition or other [facility] requiring further screening, evaluation, or treatment in the ED at this time prior to discharge.   [facility]AM [facility] ago): CHEST 2 VIEW  COMPARISON: 08/02/2014  FINDINGS: Cardiomediastinal silhouette is stable. No acute infiltrate or pulmonary edema. Central mild bronchitic changes. Bony thorax is unremarkable pre  IMPRESSION: No acute infiltrate or pulmonary edema. Central mild bronchitic changes.   Electronically Signed By: Natasha MeadLiviu Pop M.D. On: 11/13/2014 20:05  Burgess AmorJulie Dierre Crevier, PA-C 11/15/14 1649  Donnetta HutchingBrian Cook, MD 11/19/14 [phone]

## 2014-11-15 NOTE — ED Notes (Signed)
Cough for 2 weeks, green sputum, Low back pain with radiation down lt leg.

## 2014-11-15 NOTE — Discharge Instructions (Signed)
Acute Bronchitis Bronchitis is inflammation of the airways that extend from the windpipe into the lungs (bronchi). The inflammation often causes mucus to develop. This leads to a cough, which is the most common symptom of bronchitis.  In acute bronchitis, the condition usually develops suddenly and goes away over time, usually in a couple weeks. Smoking, allergies, and asthma can make bronchitis worse. Repeated episodes of bronchitis may cause further lung problems.  CAUSES Acute bronchitis is most often caused by the same virus that causes a cold. The virus can spread from person to person (contagious) through coughing, sneezing, and touching contaminated objects. SIGNS AND SYMPTOMS   Cough.   Fever.   Coughing up mucus.   Body aches.   Chest congestion.   Chills.   Shortness of breath.   Sore throat.  DIAGNOSIS  Acute bronchitis is usually diagnosed through a physical exam. Your health care provider will also ask you questions about your medical history. Tests, such as chest X-rays, are sometimes done to rule out other conditions.  TREATMENT  Acute bronchitis usually goes away in a couple weeks. Oftentimes, no medical treatment is necessary. Medicines are sometimes given for relief of fever or cough. Antibiotic medicines are usually not needed but may be prescribed in certain situations. In some cases, an inhaler may be recommended to help reduce shortness of breath and control the cough. A cool mist vaporizer may also be used to help thin bronchial secretions and make it easier to clear the chest.  HOME CARE INSTRUCTIONS  Get plenty of rest.   Drink enough fluids to keep your urine clear or pale yellow (unless you have a medical condition that requires fluid restriction). Increasing fluids may help thin your respiratory secretions (sputum) and reduce chest congestion, and it will prevent dehydration.   Take medicines only as directed by your health care provider.  If  you were prescribed an antibiotic medicine, finish it all even if you start to feel better.  Avoid smoking and secondhand smoke. Exposure to cigarette smoke or irritating chemicals will make bronchitis worse. If you are a smoker, consider using nicotine gum or skin patches to help control withdrawal symptoms. Quitting smoking will help your lungs heal faster.   Reduce the chances of another bout of acute bronchitis by washing your hands frequently, avoiding people with cold symptoms, and trying not to touch your hands to your mouth, nose, or eyes.   Keep all follow-up visits as directed by your health care provider.  SEEK MEDICAL CARE IF: Your symptoms do not improve after 1 week of treatment.  SEEK IMMEDIATE MEDICAL CARE IF:  You develop an increased fever or chills.   You have chest pain.   You have severe shortness of breath.  You have bloody sputum.   You develop dehydration.  You faint or repeatedly feel like you are going to pass out.  You develop repeated vomiting.  You develop a severe headache. MAKE SURE YOU:   Understand these instructions.  Will watch your condition.  Will get help right away if you are not doing well or get worse. Document Released: 09/02/2004 Document Revised: 12/10/2013 Document Reviewed: 01/16/2013 [facility] Patient Information 2015 [place], [place]. This information is not intended to replace advice given to you by your health care provider. Make sure you discuss any questions you have with your health care provider.  Finish the antibiotic you were prescribed on Tuesday and also continue taking the Tylenol sinus medication to help with congestion.  Use the  medications prescribed as instructed.  Use caution with the cough syrup and this will make you sleepy, do not drive within 4 hours of taking this medication.  Rest and make sure you are drinking plenty of fluids and followed your primary doctor if your symptoms are not improving with today's  and Tuesdays treatments.

## 2015-06-18 IMAGING — CR DG KNEE COMPLETE 4+V*L*
4 series · 4 of 4 positions shown · non-contrast
Comparison: None.

CLINICAL DATA: Knee injury.

EXAM:
LEFT KNEE - COMPLETE 4+ VIEW

[view not recorded (1 of 4)]
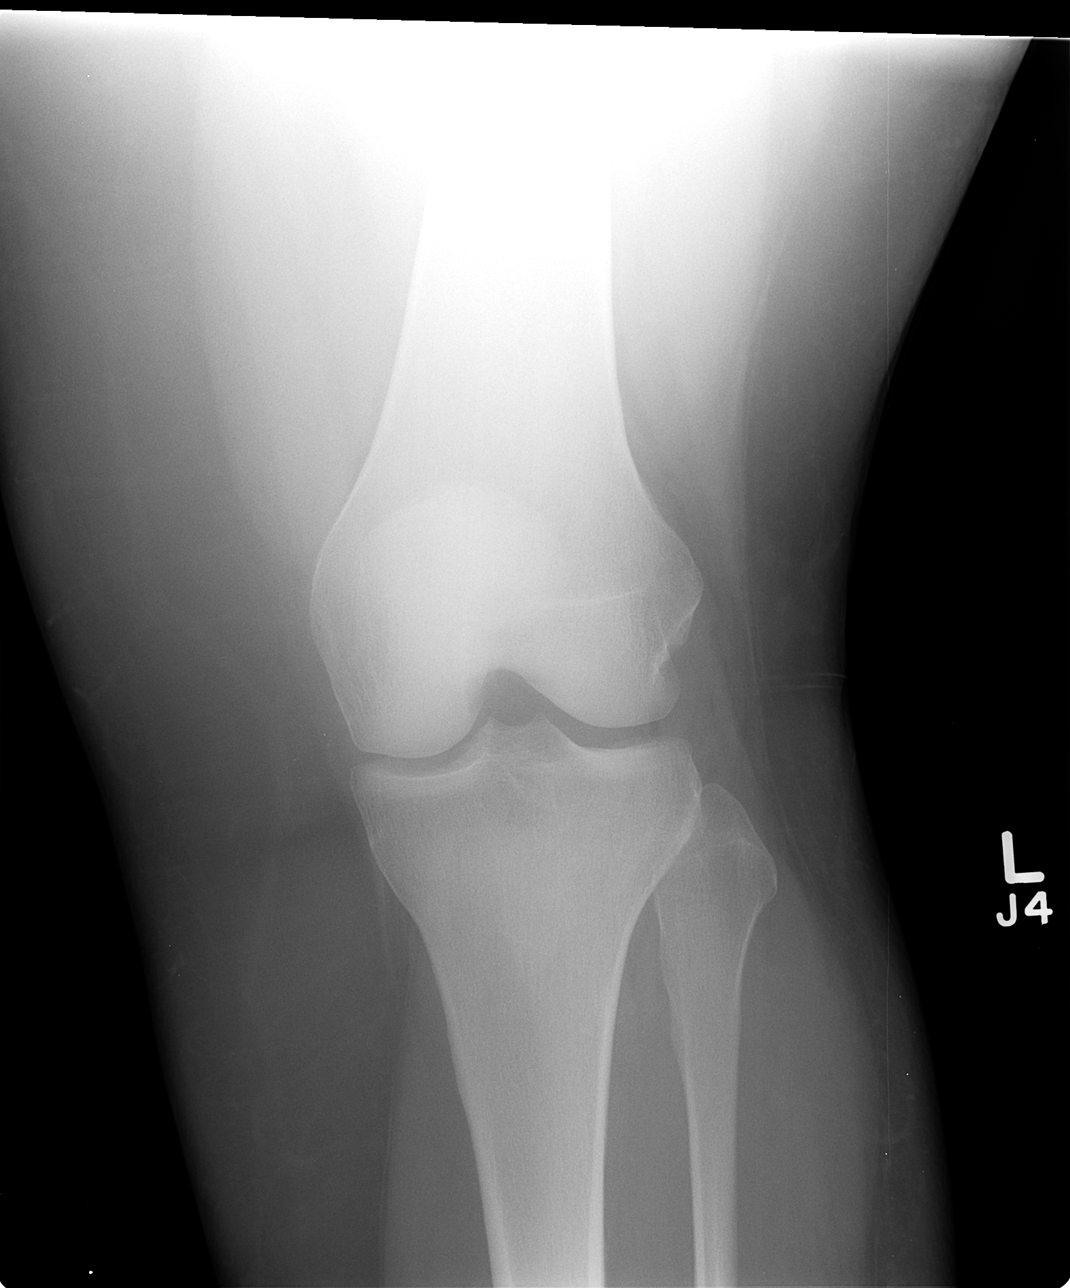

[view not recorded (2 of 4)]
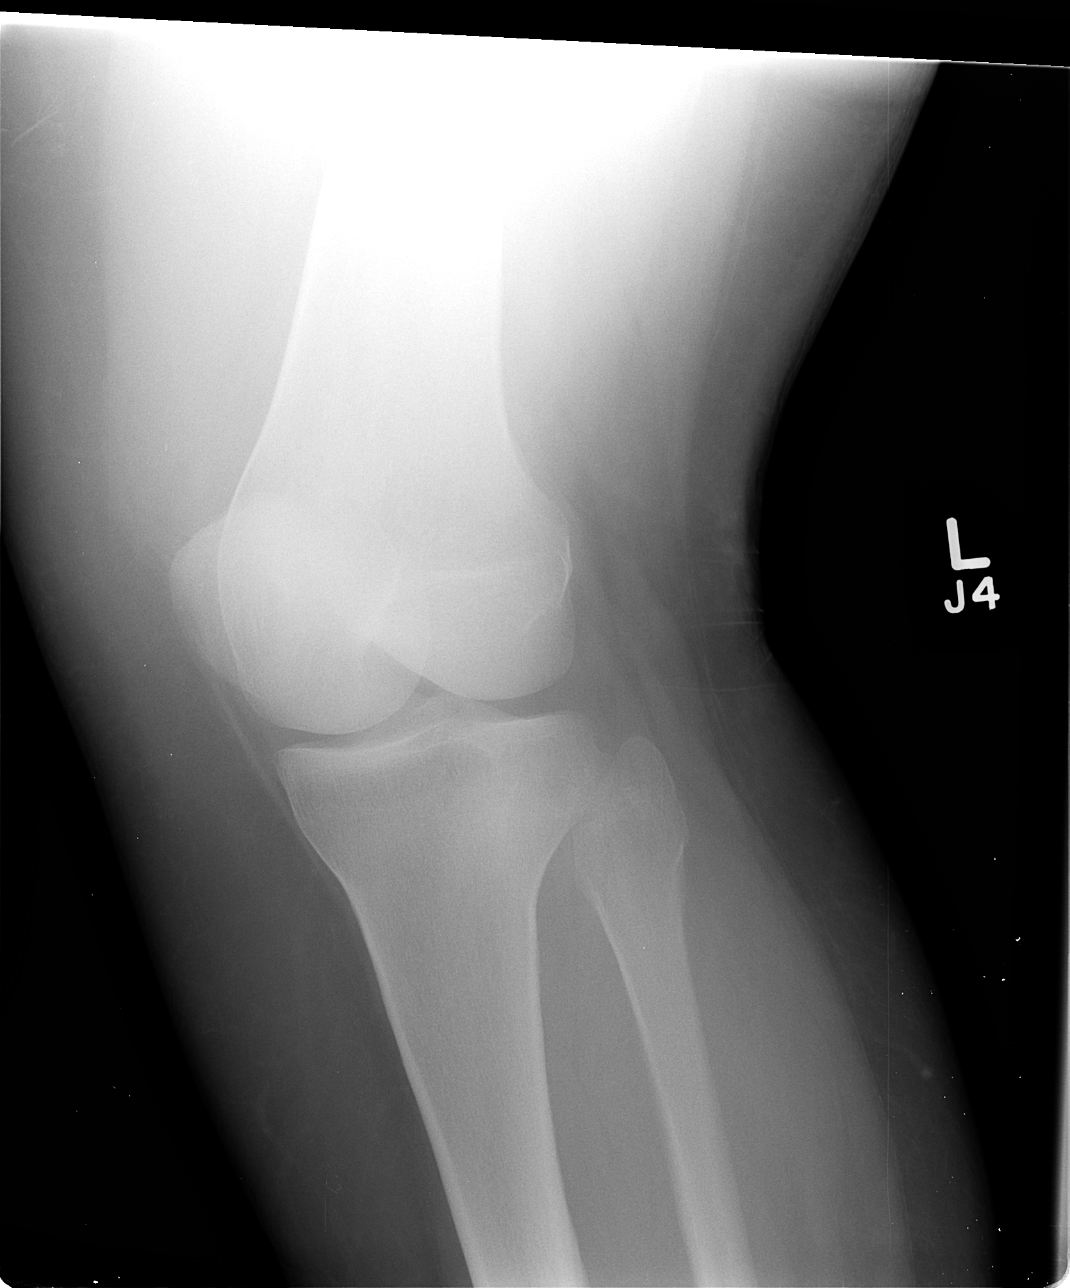

[view not recorded (3 of 4)]
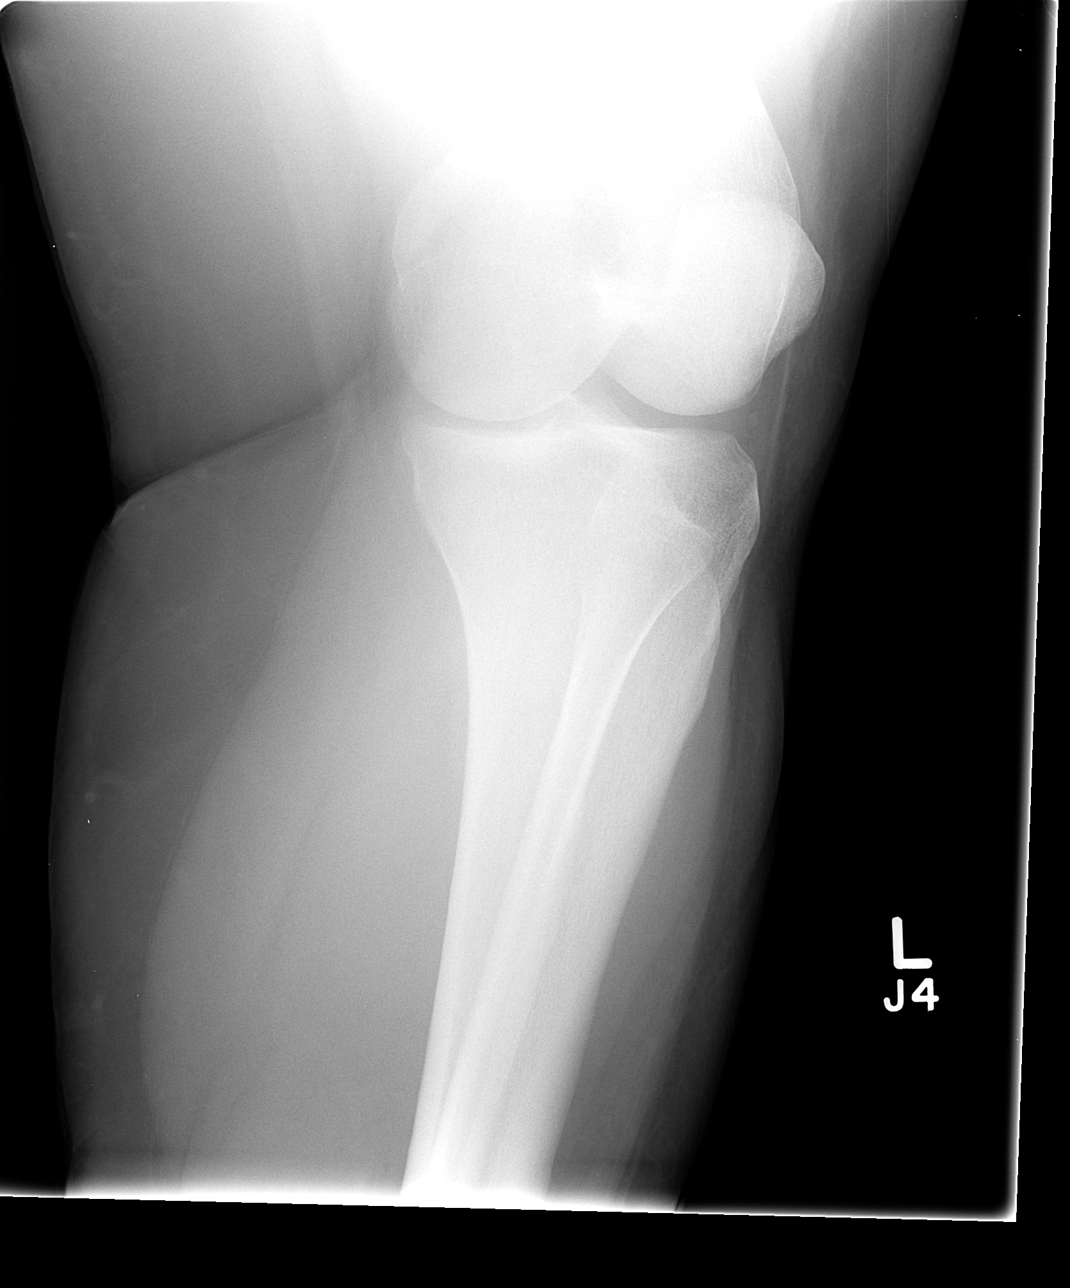

[view not recorded (4 of 4)]
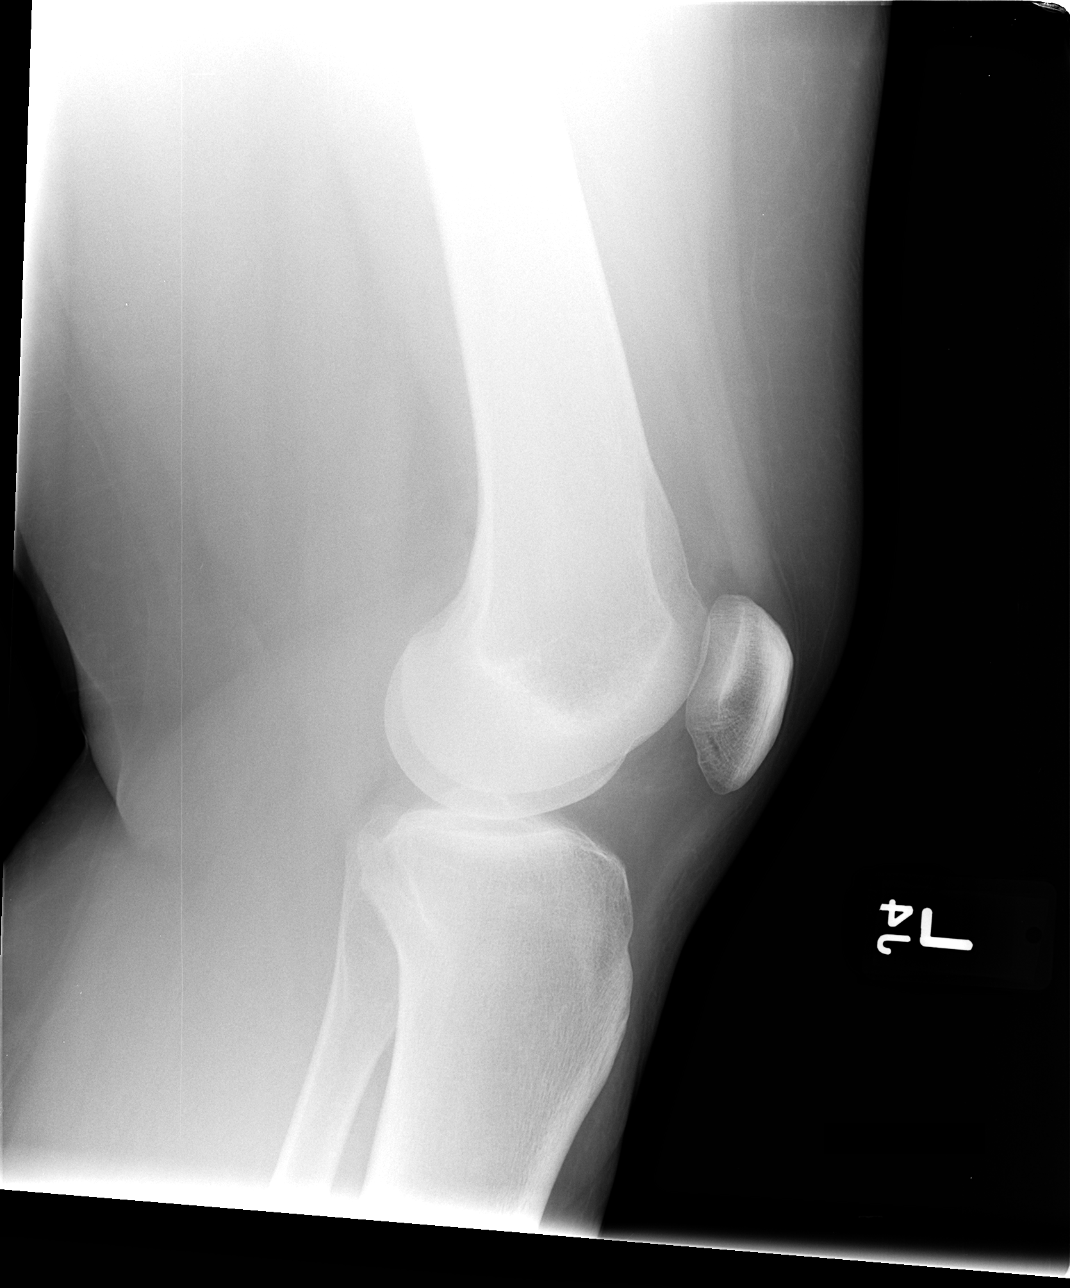

[4 of 4 positions shown; findings below may reference images not displayed]

FINDINGS: There is no evidence of fracture, dislocation, or joint effusion.
There is no evidence of arthropathy or other focal bone abnormality.
Soft tissues are unremarkable.
IMPRESSION: Negative.

## 2016-04-14 IMAGING — DX DG HAND COMPLETE 3+V*R*
3 series · 3 of 3 positions shown · non-contrast
Comparison: None.

CLINICAL DATA: Acute right hand pain, car door injury. Fourth and
fifth fingers injured.

EXAM:
RIGHT HAND - COMPLETE 3+ VIEW

[hand pa]
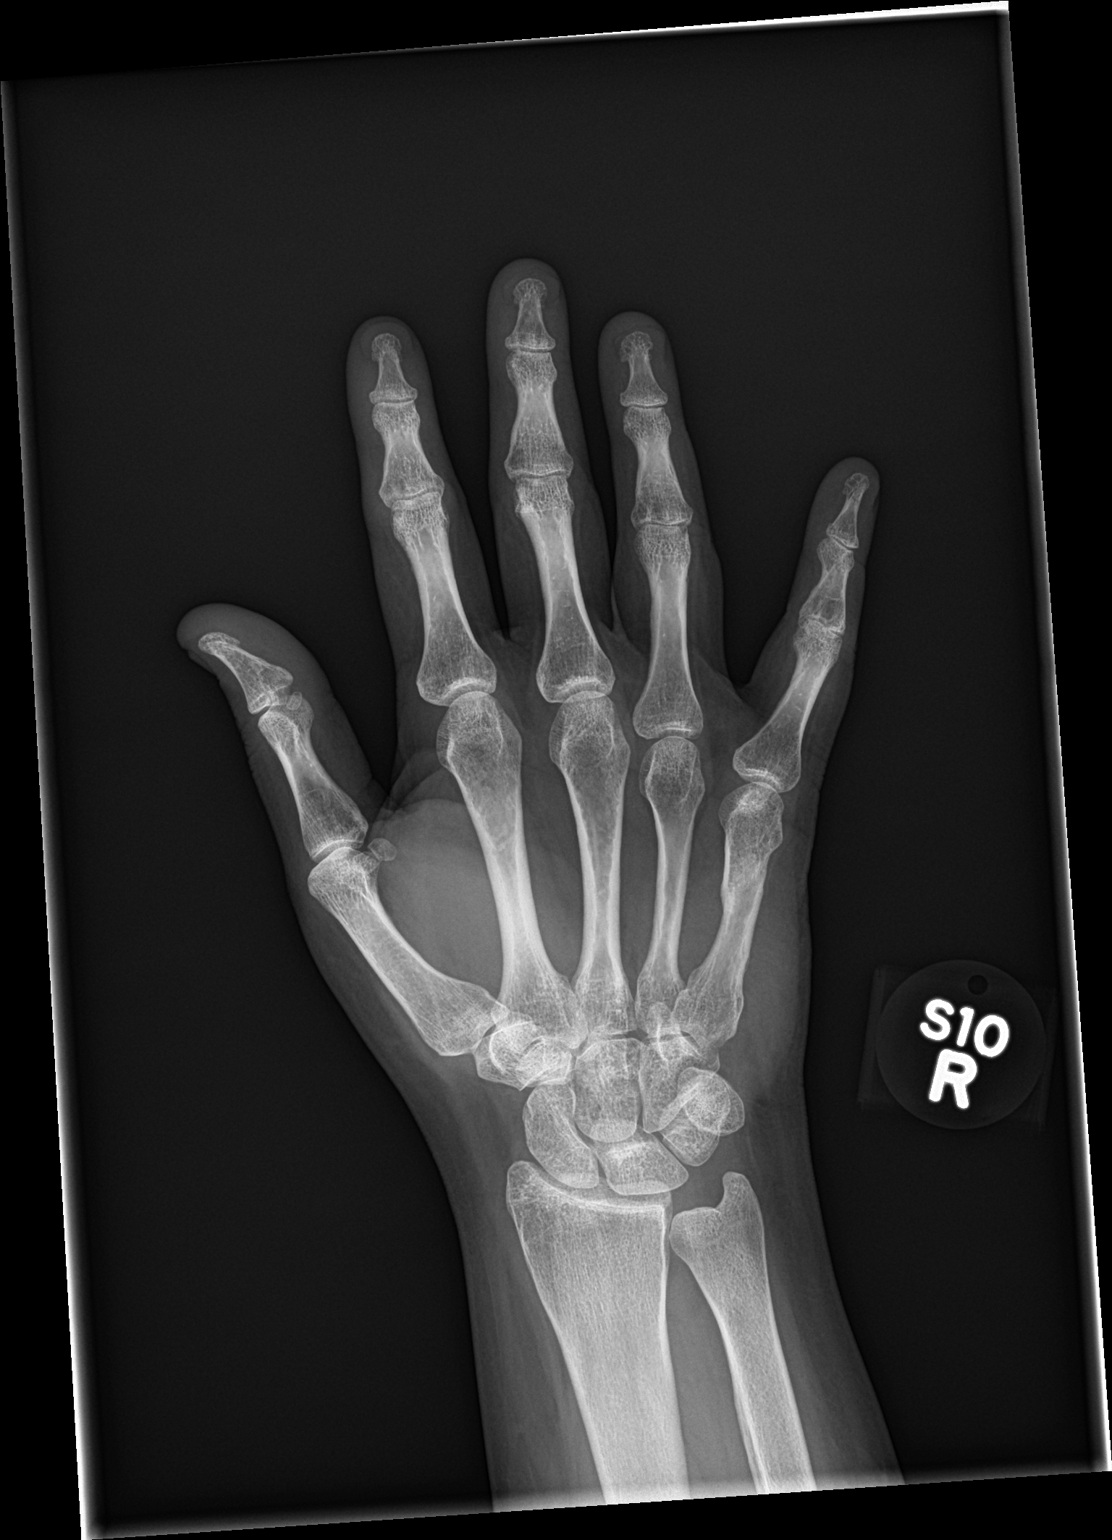

[hand obl]
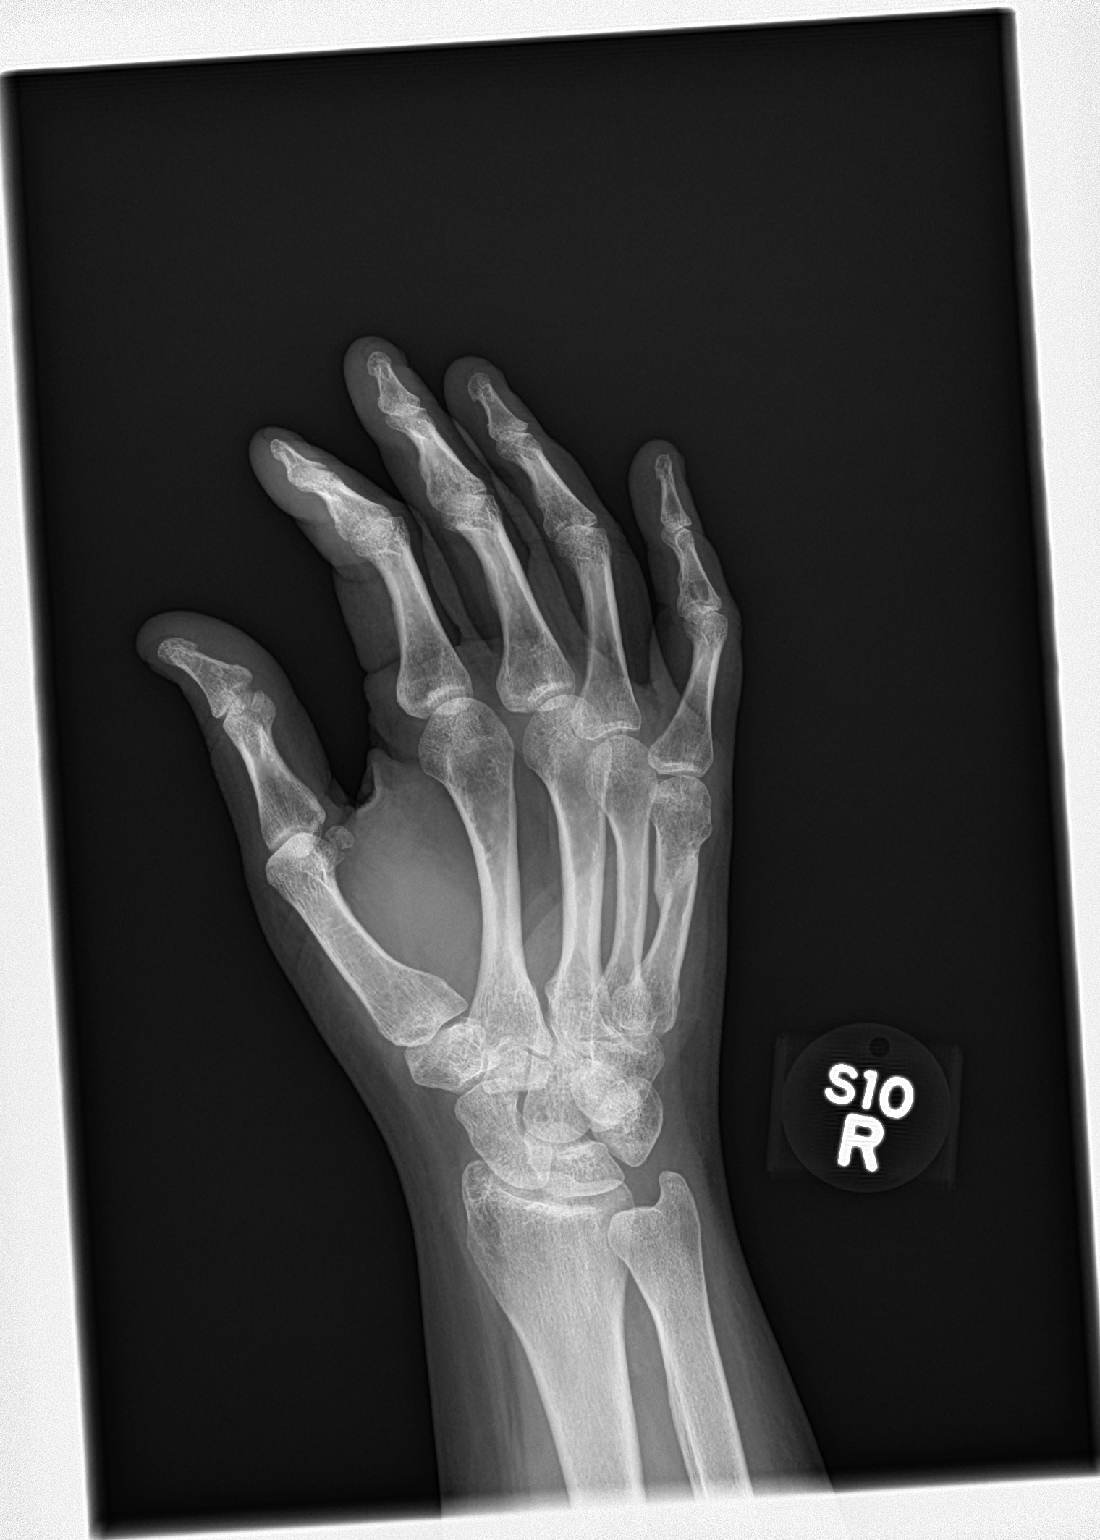

[hand lat]
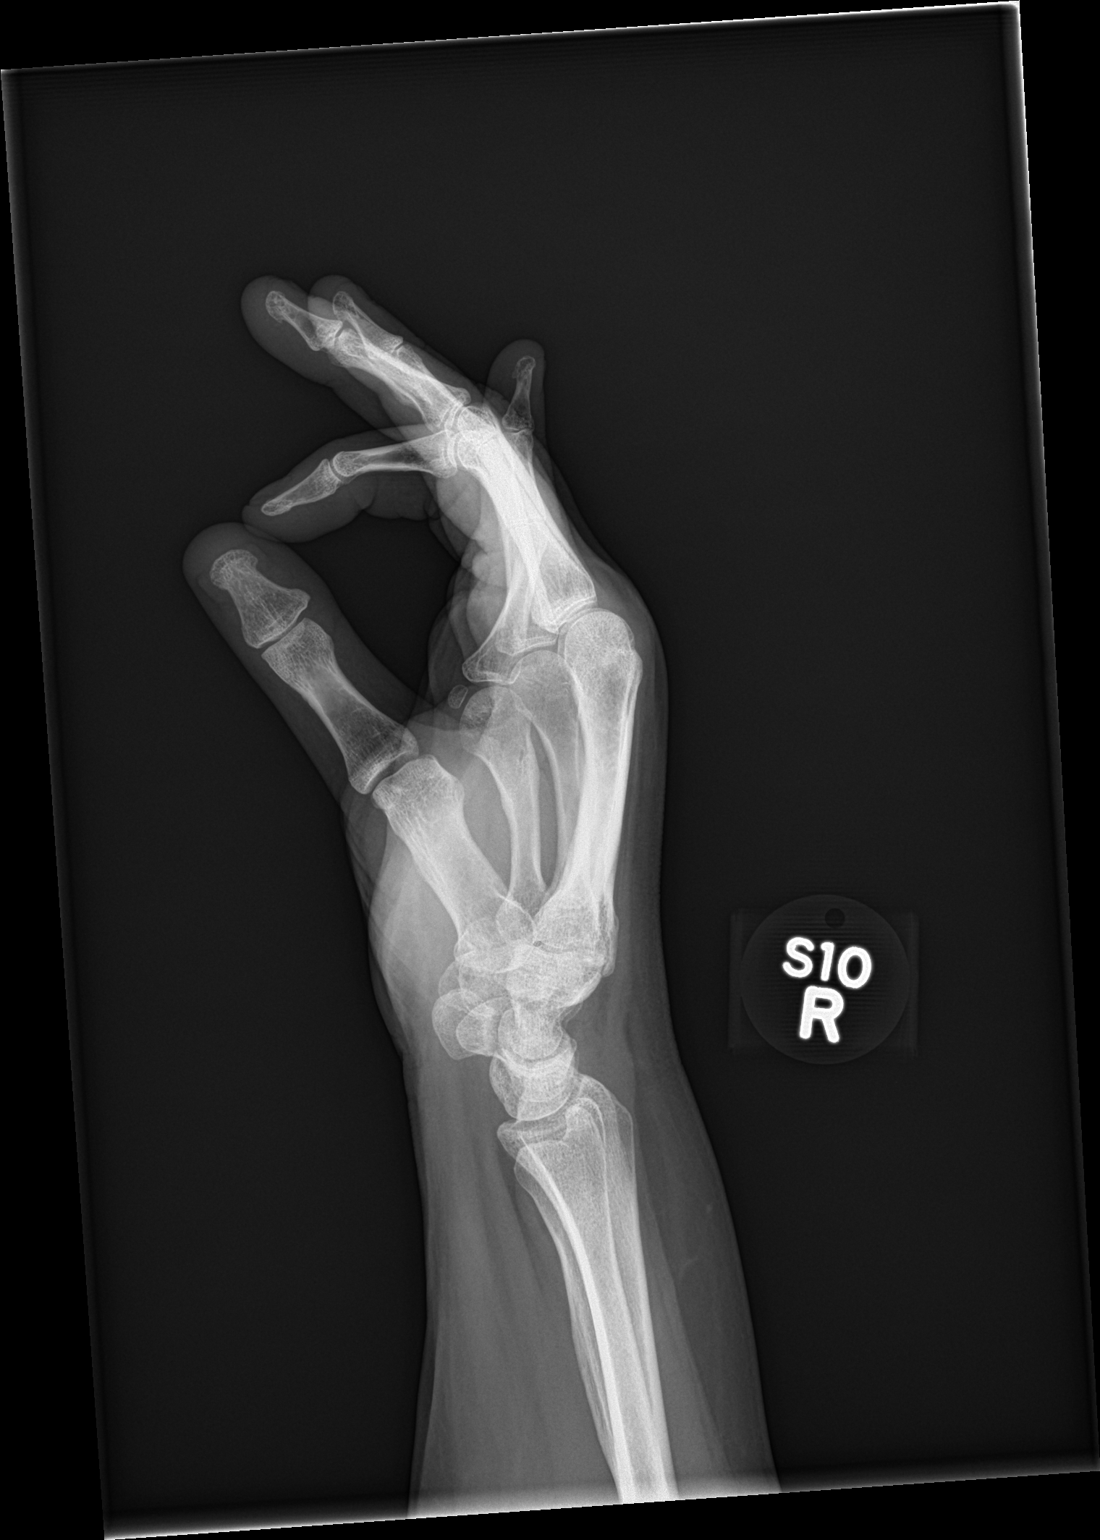

[3 of 3 positions shown; findings below may reference images not displayed]

FINDINGS: Healed right fifth metacarpal fracture noted. Normal alignment
without acute fracture. No significant arthropathy. No soft tissue
abnormality or radiopaque foreign body.
IMPRESSION: Healed fracture of the right fifth metacarpal.

No acute osseous finding.

## 2017-06-13 ENCOUNTER — Emergency Department (HOSPITAL_COMMUNITY)
Admission: EM | Admit: 2017-06-13 | Discharge: 2017-06-13 | Disposition: A | Discharge status: Home / Self Care | Payer: Self-pay | Attending: Emergency Medicine | Admitting: Emergency Medicine

## 2017-06-13 ENCOUNTER — Emergency Department (HOSPITAL_COMMUNITY): Payer: Self-pay

## 2017-06-13 ENCOUNTER — Encounter (HOSPITAL_COMMUNITY): Payer: Self-pay | Admitting: Emergency Medicine

## 2017-06-13 DIAGNOSIS — F17210 Nicotine dependence, cigarettes, uncomplicated: Secondary | ICD-10-CM | POA: Insufficient documentation

## 2017-06-13 DIAGNOSIS — Z79899 Other long term (current) drug therapy: Secondary | ICD-10-CM | POA: Insufficient documentation

## 2017-06-13 DIAGNOSIS — J40 Bronchitis, not specified as acute or chronic: Secondary | ICD-10-CM | POA: Insufficient documentation

## 2017-06-13 LAB — CBC WITH DIFFERENTIAL/PLATELET
Basophils Absolute: 0 10*3/uL (ref 0.0–0.1)
Basophils Relative: 0 %
Eosinophils Absolute: 0 10*3/uL (ref 0.0–0.7)
Eosinophils Relative: 0 %
HEMATOCRIT: 38.2 % (ref 36.0–46.0)
HEMOGLOBIN: 12.4 g/dL (ref 12.0–15.0)
LYMPHS ABS: 1.9 10*3/uL (ref 0.7–4.0)
LYMPHS PCT: 16 %
MCH: 30.8 pg (ref 26.0–34.0)
MCHC: 32.5 g/dL (ref 30.0–36.0)
MCV: 94.8 fL (ref 78.0–100.0)
MONO ABS: 0.5 10*3/uL (ref 0.1–1.0)
MONOS PCT: 4 %
NEUTROS ABS: 9 10*3/uL — AB (ref 1.7–7.7)
NEUTROS PCT: 80 %
Platelets: 263 10*3/uL (ref 150–400)
RBC: 4.03 MIL/uL (ref 3.87–5.11)
RDW: 12.4 % (ref 11.5–15.5)
WBC: 11.3 10*3/uL — ABNORMAL HIGH (ref 4.0–10.5)

## 2017-06-13 LAB — BASIC METABOLIC PANEL
Anion gap: 11 (ref 5–15)
BUN: 13 mg/dL (ref 6–20)
CALCIUM: 9 mg/dL (ref 8.9–10.3)
CHLORIDE: 105 mmol/L (ref 101–111)
CO2: 24 mmol/L (ref 22–32)
CREATININE: 0.76 mg/dL (ref 0.44–1.00)
GFR calc non Af Amer: >60 mL/min (ref >60)
GLUCOSE: 139 mg/dL — AB (ref 65–99)
Potassium: 3.6 mmol/L (ref 3.5–5.1)
Sodium: 140 mmol/L (ref 135–145)

## 2017-06-13 LAB — D-DIMER, QUANTITATIVE: D-Dimer, Quant: <0.27 ug/mL-FEU (ref 0.00–0.50)

## 2017-06-13 MED ORDER — IPRATROPIUM-ALBUTEROL 0.5-2.5 (3) MG/3ML IN SOLN
3.0000 mL | Freq: Once | RESPIRATORY_TRACT | Status: AC
  Administered 2017-06-13: 3 mL via RESPIRATORY_TRACT
  Filled 2017-06-13: qty 3

## 2017-06-13 MED ORDER — ALBUTEROL SULFATE HFA 108 (90 BASE) MCG/ACT IN AERS: 2.0000 | INHALATION_SPRAY | RESPIRATORY_TRACT | 0 refills | Status: AC | PRN

## 2017-06-13 MED ORDER — ALBUTEROL SULFATE (2.5 MG/3ML) 0.083% IN NEBU
2.5000 mg | INHALATION_SOLUTION | Freq: Once | RESPIRATORY_TRACT | Status: AC
  Administered 2017-06-13: 2.5 mg via RESPIRATORY_TRACT
  Filled 2017-06-13: qty 3

## 2017-06-13 MED ORDER — AZITHROMYCIN 250 MG PO TABS: ORAL_TABLET | ORAL | 0 refills | Status: DC

## 2017-06-13 NOTE — ED Provider Notes (Signed)
[facility] EMERGENCY DEPARTMENT Provider Note   CSN: [id number] Arrival date & time: 06/13/17  1653     History   Chief Complaint Chief Complaint  Patient presents with  . Cough    HPI Pamela Duran is a 36 y.o. female.  HPI   Pamela Duran is a 36 y.o. female who presents to the Emergency Department complaining of persistent cough for 4 days.  Describes an occasionally productive cough associated with nasal congestion and low grade fever.  She was seen at another facility 2 days ago and given prednisone taper and cough syrup.  She reports her symptoms are not improving.  She describes chest tightness associated with cough and shortness of breath with coughing and exertion.  She denies hemoptysis, sore throat, ear pain, chest pain or vomiting.    Past Medical History:  Diagnosis Date  . Asthma   . Chronic back pain   . Complex anorectal malformation   . Depression    complex regional pain syndrome   . History of migraine headaches   . Sciatica     Patient Active Problem List   Diagnosis Date Noted  . Palpitations 10/27/2010  . Tobacco abuse 10/27/2010  . Dyspnea 10/27/2010    Past Surgical History:  Procedure Laterality Date  . Bilateral tubal ligation    . CHOLECYSTECTOMY    . GALLBLADDER SURGERY    . HAND SURGERY      OB History    No data available       Home Medications    Prior to Admission medications   Medication Sig Start Date End Date Taking? Authorizing Provider  albuterol (PROVENTIL HFA;VENTOLIN HFA) 108 (90 BASE) MCG/ACT inhaler Inhale 1-2 puffs into the lungs every 6 (six) hours as needed for wheezing or shortness of breath. 11/15/14   Burgess AmorIdol, Julie, PA-C  cetirizine (ZYRTEC ALLERGY) 10 MG tablet Take 10 mg by mouth every evening.     [provider]  chlorpheniramine-HYDROcodone (TUSSIONEX PENNKINETIC ER) 10-8 MG/5ML LQCR Take 5 mLs by mouth every 12 (twelve) hours as needed for cough. 09/01/14   Geoffery Lyonselo, Douglas, MD    cyclobenzaprine (FLEXERIL) 10 MG tablet Take 10 mg by mouth at bedtime.    [provider]  FLUoxetine (PROZAC) 20 MG capsule Take 20 mg by mouth daily.    [provider]  HYDROcodone-acetaminophen (NORCO/VICODIN) 5-325 MG per tablet Take 1 tablet by mouth every 4 (four) hours as needed. 06/10/14   Elson AreasSofia, Leslie K, PA-C  levonorgestrel (MIRENA) 20 MCG/24HR IUD 1 each by Intrauterine route once.    [provider]  omeprazole (PRILOSEC) 20 MG capsule Take 20 mg by mouth every morning.     [provider]  oxyCODONE-acetaminophen (PERCOCET/ROXICET) 5-325 MG per tablet Take 1 tablet by mouth every 4 (four) hours as needed. 10/29/14   Burgess AmorIdol, Julie, PA-C  predniSONE (DELTASONE) 10 MG tablet 6, 5, 4, 3, 2 then 1 tablet by mouth daily for 6 days total. 11/15/14   Idol, Raynelle FanningJulie, PA-C  promethazine-codeine (PHENERGAN WITH CODEINE) 6.25-10 MG/5ML syrup Take 5 mLs by mouth every 4 (four) hours as needed for cough. 11/15/14   Burgess AmorIdol, Julie, PA-C  pseudoephedrine (SUDAFED) 30 MG tablet Take 30 mg by mouth every 4 (four) hours as needed for congestion.    [provider]    Family History Family History  Problem Relation Age of Onset  . Healthy Mother        No premature cardiovascular disease or known cardiac  arrhythmias    Social History Social History   Tobacco Use  . Smoking status: Current Every Day Smoker    Packs/day: 1.00    Types: Cigarettes    Last attempt to quit: 06/09/2010    Years since quitting: 7.0  . Smokeless tobacco: Never Used  Substance Use Topics  . Alcohol use: Yes    Alcohol/week: 0.0 oz    Comment: rarely  . Drug use: No     Allergies   Penicillins and Sulfa antibiotics   Review of Systems Review of Systems  Constitutional: Positive for fever. Negative for appetite change and chills.  HENT: Positive for congestion. Negative for sore throat and trouble swallowing.   Respiratory: Positive for cough, chest tightness and shortness  of breath. Negative for wheezing.   Cardiovascular: Negative for chest pain and leg swelling.  Gastrointestinal: Negative for abdominal pain, nausea and vomiting.  Genitourinary: Negative for flank pain.  Musculoskeletal: Negative for arthralgias and myalgias.  Skin: Negative for rash.  Neurological: Negative for dizziness, weakness and numbness.  Hematological: Negative for adenopathy.  All other systems reviewed and are negative.    Physical Exam Updated Vital Signs BP (!) 162/69 (BP Location: Right Arm)   Pulse 64   Temp 98.6 F (37 C) (Oral)   Resp 18   Ht 5\' 5"  (1.651 m)   Wt 119.3 kg (263 lb)   LMP 06/09/2017   SpO2 98%   BMI 43.77 kg/m   Physical Exam  Constitutional: She is oriented to person, place, and time. She appears well-developed and well-nourished. No distress.  HENT:  Head: Normocephalic and atraumatic.  Right Ear: Tympanic membrane and ear canal normal.  Left Ear: Tympanic membrane and ear canal normal.  Mouth/Throat: Uvula is midline, oropharynx is clear and moist and mucous membranes are normal. No oropharyngeal exudate.  Eyes: EOM are normal. Pupils are equal, round, and reactive to light.  Neck: Normal range of motion, full passive range of motion without pain and phonation normal. Neck supple.  Cardiovascular: Normal rate, regular rhythm, normal heart sounds and intact distal pulses.  No murmur heard. Pulmonary/Chest: Effort normal. No stridor. No respiratory distress. She has no wheezes. She has no rales. She exhibits no tenderness.  Slightly diminished lung sounds bilaterally.  No rales.  No wheezing.  Abdominal: Soft. She exhibits no distension. There is no tenderness. There is no guarding.  Musculoskeletal: Normal range of motion. She exhibits no edema.  Lymphadenopathy:    She has no cervical adenopathy.  Neurological: She is alert and oriented to person, place, and time. She exhibits normal muscle tone. Coordination normal.  Skin: Skin is warm  and dry. Capillary refill takes less than 2 seconds.  Nursing note and vitals reviewed.    ED Treatments / Results  Labs (all labs ordered are listed, but only abnormal results are displayed) Labs Reviewed - No data to display  EKG  EKG Interpretation None       Radiology Dg Chest 2 View  Result Date: 06/13/2017 CLINICAL DATA:  Two-day history of shortness of breath. EXAM: CHEST  2 VIEW COMPARISON:  06/11/2017 [facility]. FINDINGS: Cardiomediastinal silhouette unremarkable, unchanged. Prominent bronchovascular markings and mild to moderate central peribronchial thickening, more so than on the examination 2 days ago. Lungs otherwise clear. No localized airspace consolidation. No pleural effusions. No pneumothorax. Normal pulmonary vascularity. Visualized bony thorax intact. IMPRESSION: Mild-to-moderate changes of acute bronchitis and/or asthma without evidence of airspace pneumonia. Electronically Signed   By: Hulan Saas  M.D.   On: 06/13/2017 19:06     Procedures Procedures (including critical care time)  Medications Ordered in ED Medications  ipratropium-albuterol (DUONEB) 0.5-2.5 (3) MG/3ML nebulizer solution 3 mL (not administered)  albuterol (PROVENTIL) (2.5 MG/3ML) 0.083% nebulizer solution 2.5 mg (not administered)     Initial Impression / Assessment and Plan / ED Course  I have reviewed the triage vital signs and the nursing notes.  Pertinent labs & imaging results that were available during my care of the patient were reviewed by me and considered in my medical decision making (see chart for details).     Patient well-appearing.  No tachycardia, no tachypnea.  No hypoxia.  Lung sounds improved after albuterol neb and pt feeling better.  Pt appears stable for d/c.  Return precautions discussed.   Final Clinical Impressions(s) / ED Diagnoses   Final diagnoses:  Bronchitis    ED Discharge Orders    None       Pauline Aus, PA-C 06/16/17 1258     Bethann Berkshire, MD 06/20/17 1230

## 2017-06-13 NOTE — ED Triage Notes (Signed)
PT stated she was started on prednisone and hydrocodone cough syrup on 06/11/17 from [facility]. PT states productive green sputum cough x4 days and nasal congestion.

## 2017-06-13 NOTE — ED Notes (Signed)
RT made aware of breathing tx. 

## 2017-06-13 NOTE — Discharge Instructions (Signed)
Continue taking your prednisone and cough syrup as directed.  Drink plenty of fluids.  Return to ER for any worsening symptoms.  You can contact one of the providers listed to establish primary care   [facility] Doctor List    Kari BaarsEdward Hawkins MD. Specialty: Pulmonary Disease Contact information: [address]  [address]  [place] [address]  [phone]   Syliva OvermanMargaret Simpson, MD. Specialty: [facility] Contact information: [address]  [place] [address]  [phone]   Lilyan PuntScott Luking, MD. Specialty: [facility] Contact information: [address]  [address]  [place] [address]  [phone]   Avon Gullyesfaye Fanta, MD Specialty: Internal Medicine Contact information: [address]  [place] [address]  [phone]   Catalina PizzaZach Hall, MD. Specialty: Internal Medicine Contact information: [address]  [place] [address]  [phone]    [facility] (Dr. Selena BattenKim) Specialty: Family Medicine Contact information: [address]  [place] [address]  [phone]   John GiovanniStephen Knowlton, MD. Specialty: [facility] Contact information: [address]  [address]  [place] [address]  [phone]   Carylon Perchesoy Fagan, MD. Specialty: Internal Medicine Contact information: [address]  [address]  [place] [address]  [phone]    [facility]  [address] [place] [address] [phone]  Services [facility] offers a variety of basic health services.  Services include but are not limited to: Blood pressure checks  Heart rate checks  Blood sugar checks  Urine analysis  Rapid strep tests  Pregnancy tests.  Health education and referrals  People needing more complex services will be directed to a physician online. Using these virtual visits, doctors can evaluate and prescribe medicine and treatments. There will be no medication  on-site, though [facility] will help patients fill their prescriptions at little to no cost.   For More information please go to: [email or web address]

## 2018-01-05 ENCOUNTER — Encounter (HOSPITAL_COMMUNITY): Payer: Self-pay

## 2018-01-05 ENCOUNTER — Other Ambulatory Visit: Payer: Self-pay

## 2018-01-05 ENCOUNTER — Emergency Department (HOSPITAL_COMMUNITY)
Admission: EM | Admit: 2018-01-05 | Discharge: 2018-01-06 | Disposition: A | Discharge status: Home / Self Care | Payer: Self-pay | Attending: Emergency Medicine | Admitting: Emergency Medicine

## 2018-01-05 DIAGNOSIS — R609 Edema, unspecified: Secondary | ICD-10-CM | POA: Insufficient documentation

## 2018-01-05 DIAGNOSIS — Z79899 Other long term (current) drug therapy: Secondary | ICD-10-CM | POA: Insufficient documentation

## 2018-01-05 DIAGNOSIS — R21 Rash and other nonspecific skin eruption: Secondary | ICD-10-CM | POA: Insufficient documentation

## 2018-01-05 DIAGNOSIS — F17210 Nicotine dependence, cigarettes, uncomplicated: Secondary | ICD-10-CM | POA: Insufficient documentation

## 2018-01-05 DIAGNOSIS — M79661 Pain in right lower leg: Secondary | ICD-10-CM | POA: Insufficient documentation

## 2018-01-05 DIAGNOSIS — J45909 Unspecified asthma, uncomplicated: Secondary | ICD-10-CM | POA: Insufficient documentation

## 2018-01-05 DIAGNOSIS — M79662 Pain in left lower leg: Secondary | ICD-10-CM | POA: Insufficient documentation

## 2018-01-05 LAB — COMPREHENSIVE METABOLIC PANEL
ALK PHOS: 47 U/L (ref 38–126)
ALT: 18 U/L (ref 14–54)
ANION GAP: 5 (ref 5–15)
AST: 18 U/L (ref 15–41)
Albumin: 3.8 g/dL (ref 3.5–5.0)
BUN: 10 mg/dL (ref 6–20)
CALCIUM: 9 mg/dL (ref 8.9–10.3)
CO2: 28 mmol/L (ref 22–32)
Chloride: 107 mmol/L (ref 101–111)
Creatinine, Ser: 0.88 mg/dL (ref 0.44–1.00)
GFR calc non Af Amer: >60 mL/min (ref >60)
Glucose, Bld: 102 mg/dL — ABNORMAL HIGH (ref 65–99)
POTASSIUM: 3.8 mmol/L (ref 3.5–5.1)
Sodium: 140 mmol/L (ref 135–145)
Total Bilirubin: 0.4 mg/dL (ref 0.3–1.2)
Total Protein: 6.9 g/dL (ref 6.5–8.1)

## 2018-01-05 LAB — CBC WITH DIFFERENTIAL/PLATELET
BASOS PCT: 0 %
Basophils Absolute: 0 10*3/uL (ref 0.0–0.1)
Eosinophils Absolute: 0.1 10*3/uL (ref 0.0–0.7)
Eosinophils Relative: 1 %
HEMATOCRIT: 38.1 % (ref 36.0–46.0)
HEMOGLOBIN: 11.9 g/dL — AB (ref 12.0–15.0)
LYMPHS PCT: 43 %
Lymphs Abs: 3 10*3/uL (ref 0.7–4.0)
MCH: 29.2 pg (ref 26.0–34.0)
MCHC: 31.2 g/dL (ref 30.0–36.0)
MCV: 93.6 fL (ref 78.0–100.0)
MONO ABS: 0.6 10*3/uL (ref 0.1–1.0)
Monocytes Relative: 9 %
NEUTROS ABS: 3.3 10*3/uL (ref 1.7–7.7)
Neutrophils Relative %: 47 %
Platelets: 255 10*3/uL (ref 150–400)
RBC: 4.07 MIL/uL (ref 3.87–5.11)
RDW: 12.6 % (ref 11.5–15.5)
WBC: 7.1 10*3/uL (ref 4.0–10.5)

## 2018-01-05 LAB — URINALYSIS, ROUTINE W REFLEX MICROSCOPIC
Bilirubin Urine: NEGATIVE
Glucose, UA: NEGATIVE mg/dL
Hgb urine dipstick: NEGATIVE
KETONES UR: NEGATIVE mg/dL
Leukocytes, UA: NEGATIVE
NITRITE: NEGATIVE
PH: 6 (ref 5.0–8.0)
Protein, ur: NEGATIVE mg/dL
Specific Gravity, Urine: 1.018 (ref 1.005–1.030)

## 2018-01-05 LAB — POC URINE PREG, ED: Preg Test, Ur: NEGATIVE

## 2018-01-05 LAB — SEDIMENTATION RATE: Sed Rate: 15 mm/hr (ref 0–22)

## 2018-01-05 MED ORDER — PREDNISONE 10 MG PO TABS
60.0000 mg | ORAL_TABLET | Freq: Once | ORAL | Status: AC
  Administered 2018-01-06: 60 mg via ORAL
  Filled 2018-01-05: qty 1

## 2018-01-05 MED ORDER — FUROSEMIDE 20 MG PO TABS: 20.0000 mg | ORAL_TABLET | Freq: Every day | ORAL | 0 refills | Status: AC

## 2018-01-05 MED ORDER — TRAMADOL HCL 50 MG PO TABS: 50.0000 mg | ORAL_TABLET | Freq: Four times a day (QID) | ORAL | 0 refills | Status: DC | PRN

## 2018-01-05 MED ORDER — PREDNISONE 20 MG PO TABS: 60.0000 mg | ORAL_TABLET | ORAL | 0 refills | Status: DC

## 2018-01-05 NOTE — ED Provider Notes (Signed)
[facility] EMERGENCY DEPARTMENT Provider Note   CSN: [id number] Arrival date & time: 01/05/18  2114     History   Chief Complaint Chief Complaint  Patient presents with  . Rash    HPI Pamela Duran is a 37 y.o. female.  The history is provided by the patient.  She has history of asthma and chronic back pain and comes in with swelling of her feet and ankles and rash and pain in the same area.  She has noted swelling of her feet and ankles for several months.  She started breaking out in a rash in her lower leg about 2 days ago.  The feet and ankle have been painful from the beginning of her symptoms.  She denies fever or chills.  She denies other arthralgias or myalgias.  She denies other rash.  She has been taking ibuprofen which has not been giving her pain relief.  Past Medical History:  Diagnosis Date  . Asthma   . Chronic back pain   . Complex anorectal malformation   . Depression    complex regional pain syndrome   . History of migraine headaches   . Sciatica     Patient Active Problem List   Diagnosis Date Noted  . Palpitations 10/27/2010  . Tobacco abuse 10/27/2010  . Dyspnea 10/27/2010    Past Surgical History:  Procedure Laterality Date  . Bilateral tubal ligation    . CHOLECYSTECTOMY    . GALLBLADDER SURGERY    . HAND SURGERY       OB History   None      Home Medications    Prior to Admission medications   Medication Sig Start Date End Date Taking? Authorizing Provider  albuterol (PROVENTIL HFA;VENTOLIN HFA) 108 (90 Base) MCG/ACT inhaler Inhale 2 puffs every 4 (four) hours as needed into the lungs for wheezing or shortness of breath. 06/13/17   Triplett, Tammy, PA-C  azithromycin (ZITHROMAX) 250 MG tablet Take first 2 tablets together, then 1 every day until finished. 06/13/17   Triplett, Tammy, PA-C  cetirizine (ZYRTEC ALLERGY) 10 MG tablet Take 10 mg by mouth every evening.     [provider]  HYDROcodone-acetaminophen  (NORCO/VICODIN) 5-325 MG per tablet Take 1 tablet by mouth every 4 (four) hours as needed. 06/10/14   Elson Areas, PA-C  HYDROcodone-homatropine (HYCODAN) 5-1.5 MG/5ML syrup Take 5 mLs every 6 (six) hours as needed by mouth for cough.    [provider]  predniSONE (DELTASONE) 20 MG tablet Take 20 mg See admin instructions by mouth. Take one tablet by mouth three times daily for 4 days, then take one tablet twice daily for 3 days stating on 06/12/2017    [provider]    Family History Family History  Problem Relation Age of Onset  . Healthy Mother        No premature cardiovascular disease or known cardiac arrhythmias    Social History Social History   Tobacco Use  . Smoking status: Current Every Day Smoker    Packs/day: 1.00    Types: Cigarettes    Last attempt to quit: 06/09/2010    Years since quitting: 7.5  . Smokeless tobacco: Never Used  Substance Use Topics  . Alcohol use: Yes    Alcohol/week: 0.0 oz    Comment: rarely  . Drug use: No    Types: Marijuana     Allergies   Penicillins and Sulfa antibiotics   Review of Systems Review of Systems  All other systems reviewed and are negative.    Physical Exam Updated Vital Signs BP 116/66 (BP Location: Right Arm)   Pulse 68   Temp 98.3 F (36.8 C) (Oral)   Resp 16   Ht  (1.651 m)   Wt 114.8 kg (253 lb)   LMP 12/20/2017   SpO2 100%   BMI 42.10 kg/m   Physical Exam  Nursing note and vitals reviewed.  37 year old female, resting comfortably and in no acute distress. Vital signs are normal. Oxygen saturation is 100%, which is normal. Head is normocephalic and atraumatic. PERRLA, EOMI. Oropharynx is clear. Neck is nontender and supple without adenopathy or JVD. Back is nontender and there is no CVA tenderness. Lungs are clear without rales, wheezes, or rhonchi. Chest is nontender. Heart has regular rate and rhythm without murmur. Abdomen is soft, flat, nontender without masses or  hepatosplenomegaly and peristalsis is normoactive. Extremities: 2+ pitting edema in feet and lower legs.  Diffuse tenderness throughout this area.  Faint, reticular rash over the posterior aspect of ankles with some extension into the dorsum of the feet. Skin is warm and dry without other rash. Neurologic: Mental status is normal, cranial nerves are intact, there are no motor or sensory deficits.  ED Treatments / Results  Labs (all labs ordered are listed, but only abnormal results are displayed) Labs Reviewed  COMPREHENSIVE METABOLIC PANEL - Abnormal; Notable for the following components:      Result Value   Glucose, Bld 102 (*)    All other components within normal limits  CBC WITH DIFFERENTIAL/PLATELET - Abnormal; Notable for the following components:   Hemoglobin 11.9 (*)    All other components within normal limits  URINALYSIS, ROUTINE W REFLEX MICROSCOPIC - Abnormal; Notable for the following components:   APPearance HAZY (*)    All other components within normal limits  SEDIMENTATION RATE  POC URINE PREG, ED    Procedures Procedures  Medications Ordered in ED Medications  predniSONE (DELTASONE) tablet 60 mg (has no administration in time range)     Initial Impression / Assessment and Plan / ED Course  I have reviewed the triage vital signs and the nursing notes.  Pertinent lab results that were available during my care of the patient were reviewed by me and considered in my medical decision making (see chart for details).  Peripheral edema with rash and tenderness.  This appears to be more than simple edema.  I am concerned about connective tissue disorders.  Screening labs are obtained including sedimentation rate.  Old records are reviewed, and she has no relevant past visits.  Laboratory work-up is unremarkable, including sedimentation rate of 15.  I am still suspicious that this is related to an autoimmune process.  She is discharged with prescription for prednisone  60 mg a day for 5 days, furosemide, tramadol.  Follow-up with PCP in 1 week.  Final Clinical Impressions(s) / ED Diagnoses   Final diagnoses:  Peripheral edema  Rash  Pain in both lower legs    ED Discharge Orders        Ordered    predniSONE (DELTASONE) 20 MG tablet  See admin instructions     01/05/18 2354    furosemide (LASIX) 20 MG tablet  Daily     01/05/18 2354    traMADol (ULTRAM) 50 MG tablet  Every 6 hours PRN     01/05/18 2354       Dione Booze, MD 01/05/18 2357

## 2018-01-05 NOTE — ED Triage Notes (Signed)
Rash to bilateral ankles. Started a couple months ago.  Rash reported as painful and swollen. Small red bumps present.

## 2018-01-05 NOTE — Discharge Instructions (Signed)
The cause for your symptoms is not clear, but I suspect it might be related to an autoimmune process.  Please take the furosemide once a day.  This is to help get rid of excess fluid.  These take the prednisone, 3 tablets at a time, once a day.  This is for inflammation.  Continue taking ibuprofen or acetaminophen for pain.  He may take tramadol for more severe pain.  Follow-up with your primary care provider as there may need to be additional testing based on how you respond to these medications.

## 2018-01-06 MED ORDER — TRAMADOL HCL 50 MG PO TABS
50.0000 mg | ORAL_TABLET | Freq: Once | ORAL | Status: AC
  Administered 2018-01-06: 50 mg via ORAL
  Filled 2018-01-06: qty 1

## 2018-03-28 ENCOUNTER — Ambulatory Visit: Payer: Commercial Managed Care - PPO | Attending: Family Medicine | Admitting: Neurology

## 2018-03-28 DIAGNOSIS — R5383 Other fatigue: Secondary | ICD-10-CM | POA: Insufficient documentation

## 2018-03-30 NOTE — Procedures (Signed)
[facility] Vu Liebman A. Gerilyn Pilgrimoonquah, MD     [email or web address]             NOCTURNAL POLYSOMNOGRAPHY   LOCATION: [facility]  Patient Name: Pamela Duran, Pamela Duran Study Date: 03/28/2018 Gender: Female D.O.B: 01/28/1981 Age (years): 36 Referring Provider: Deliah BostonBritney Joyce Height (inches): 65 Interpreting Physician: Beryle BeamsKofi Aveline Daus MD, ABSM Weight (lbs): 287 RPSGT: Alfonso EllisHedrick, Debra BMI: 48 MRN: [id number] Neck Size: 15.50 CLINICAL INFORMATION Sleep Study Type: NPSG     Indication for sleep study: Fatigue     Epworth Sleepiness Score: 16     SLEEP STUDY TECHNIQUE As per the AASM Manual for the Scoring of Sleep and Associated Events v2.3 (April 2016) with a hypopnea requiring 4% desaturations.  The channels recorded and monitored were frontal, central and occipital EEG, electrooculogram (EOG), submentalis EMG (chin), nasal and oral airflow, thoracic and abdominal wall motion, anterior tibialis EMG, snore microphone, electrocardiogram, and pulse oximetry.  MEDICATIONS Medications self-administered by patient taken the night of the study : N/A  Current Outpatient Medications:  .  albuterol (PROVENTIL HFA;VENTOLIN HFA) 108 (90 Base) MCG/ACT inhaler, Inhale 2 puffs every 4 (four) hours as needed into the lungs for wheezing or shortness of breath., Disp: 6.7 g, Rfl: 0 .  cetirizine (ZYRTEC ALLERGY) 10 MG tablet, Take 10 mg by mouth every evening. , Disp: , Rfl:  .  furosemide (LASIX) 20 MG tablet, Take 1 tablet (20 mg total) by mouth daily., Disp: 10 tablet, Rfl: 0 .  predniSONE (DELTASONE) 20 MG tablet, Take 3 tablets (60 mg total) by mouth See admin instructions. Take one tablet by mouth three times daily for 4 days, then take one tablet twice daily for 3 days stating on 06/12/2017, Disp: 15 tablet, Rfl: 0 .  traMADol (ULTRAM) 50 MG tablet, Take 1 tablet (50 mg total) by mouth every 6 (six) hours as needed., Disp: 15 tablet, Rfl: 0     SLEEP ARCHITECTURE The study was  initiated at 10:01:58 PM and ended at 4:43:58 AM.  Sleep onset time was 30.3 minutes and the sleep efficiency was 89.6%%. The total sleep time was 360.2 minutes.  Stage REM latency was 88.0 minutes.  The patient spent 1.2%% of the night in stage N1 sleep, 44.8%% in stage N2 sleep, 37.4%% in stage N3 and 16.5% in REM.  Alpha intrusion was absent.  Supine sleep was 77.46%.  RESPIRATORY PARAMETERS The overall apnea/hypopnea index (AHI) was 1.7 per hour. There were 3 total apneas, including 0 obstructive, 2 central and 1 mixed apneas. There were 7 hypopneas and 0 RERAs.  The AHI during Stage REM sleep was 7.1 per hour.  AHI while supine was 2.2 per hour.  The mean oxygen saturation was 93.3%. The minimum SpO2 during sleep was 89.0%.  moderate snoring was noted during this study.  CARDIAC DATA The 2 lead EKG demonstrated sinus rhythm. The mean heart rate was 69.2 beats per minute. Other EKG findings include: None. LEG MOVEMENT DATA The total PLMS were 0 with a resulting PLMS index of 0.0. Associated arousal with leg movement index was 4.0.  IMPRESSIONS No significant obstructive sleep apnea occurred during this study. No significant central sleep apnea occurred during this study. Clinically significant periodic limb movements did not occur during sleep. No significant associated arousals.    Argie RammingKofi A Oshea Percival, MD Diplomate, American Board of Sleep Medicine.  ELECTRONICALLY SIGNED ON:  03/30/2018, 4:21 PM [facility] PH: [phone]   FX: [phone] ACCREDITED BY THE AMERICAN ACADEMY OF  SLEEP MEDICINE

## 2020-09-30 ENCOUNTER — Other Ambulatory Visit: Payer: Self-pay | Admitting: Physician Assistant

## 2020-09-30 DIAGNOSIS — M79641 Pain in right hand: Secondary | ICD-10-CM

## 2020-10-10 ENCOUNTER — Encounter (HOSPITAL_COMMUNITY): Payer: Self-pay

## 2020-10-11 ENCOUNTER — Other Ambulatory Visit: Payer: Self-pay

## 2020-10-11 ENCOUNTER — Ambulatory Visit
Admission: RE | Admit: 2020-10-11 | Discharge: 2020-10-11 | Disposition: A | Discharge status: Home / Self Care | Payer: Medicaid Other | Source: Ambulatory Visit | Attending: Physician Assistant | Admitting: Physician Assistant

## 2020-10-11 DIAGNOSIS — M79641 Pain in right hand: Secondary | ICD-10-CM

## 2020-10-21 ENCOUNTER — Encounter: Payer: Self-pay | Admitting: Orthopedic Surgery

## 2020-10-21 ENCOUNTER — Ambulatory Visit: Payer: Self-pay | Admitting: Orthopedic Surgery

## 2020-10-21 ENCOUNTER — Other Ambulatory Visit: Payer: Self-pay

## 2020-10-21 VITALS — Ht 65.0 in | Wt 274.0 lb

## 2020-10-21 DIAGNOSIS — Z6841 Body Mass Index (BMI) 40.0 and over, adult: Secondary | ICD-10-CM

## 2020-10-21 DIAGNOSIS — G5621 Lesion of ulnar nerve, right upper limb: Secondary | ICD-10-CM

## 2020-10-21 MED ORDER — GABAPENTIN 100 MG PO CAPS: 100.0000 mg | ORAL_CAPSULE | Freq: Three times a day (TID) | ORAL | 2 refills | Status: AC
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
## 2022-10-07 ENCOUNTER — Encounter: Payer: Self-pay | Admitting: Radiology
